# Patient Record
Sex: Female | Born: 1970
Health system: Southern US, Community
[De-identification: ages and names within clinical notes are randomized; demographics above are authoritative.]

## PROBLEM LIST (undated history)

## (undated) DIAGNOSIS — M940 Chondrocostal junction syndrome [Tietze]: Secondary | ICD-10-CM

## (undated) DIAGNOSIS — T4145XA Adverse effect of unspecified anesthetic, initial encounter: Secondary | ICD-10-CM

## (undated) DIAGNOSIS — F419 Anxiety disorder, unspecified: Secondary | ICD-10-CM

## (undated) DIAGNOSIS — Z8619 Personal history of other infectious and parasitic diseases: Secondary | ICD-10-CM

## (undated) DIAGNOSIS — K259 Gastric ulcer, unspecified as acute or chronic, without hemorrhage or perforation: Secondary | ICD-10-CM

## (undated) DIAGNOSIS — L719 Rosacea, unspecified: Secondary | ICD-10-CM

## (undated) DIAGNOSIS — M1990 Unspecified osteoarthritis, unspecified site: Secondary | ICD-10-CM

## (undated) DIAGNOSIS — Z9889 Other specified postprocedural states: Secondary | ICD-10-CM

## (undated) DIAGNOSIS — B019 Varicella without complication: Secondary | ICD-10-CM

## (undated) DIAGNOSIS — L409 Psoriasis, unspecified: Secondary | ICD-10-CM

## (undated) DIAGNOSIS — T8859XA Other complications of anesthesia, initial encounter: Secondary | ICD-10-CM

## (undated) DIAGNOSIS — K2970 Gastritis, unspecified, without bleeding: Secondary | ICD-10-CM

## (undated) DIAGNOSIS — K649 Unspecified hemorrhoids: Secondary | ICD-10-CM

## (undated) DIAGNOSIS — L52 Erythema nodosum: Secondary | ICD-10-CM

## (undated) DIAGNOSIS — D259 Leiomyoma of uterus, unspecified: Secondary | ICD-10-CM

## (undated) DIAGNOSIS — F32A Depression, unspecified: Secondary | ICD-10-CM

## (undated) DIAGNOSIS — R112 Nausea with vomiting, unspecified: Secondary | ICD-10-CM

## (undated) DIAGNOSIS — K5909 Other constipation: Secondary | ICD-10-CM

## (undated) DIAGNOSIS — K219 Gastro-esophageal reflux disease without esophagitis: Secondary | ICD-10-CM

## (undated) DIAGNOSIS — F329 Major depressive disorder, single episode, unspecified: Secondary | ICD-10-CM

## (undated) DIAGNOSIS — F450 Somatization disorder: Secondary | ICD-10-CM

## (undated) HISTORY — DX: Depression, unspecified: F32.A

## (undated) HISTORY — DX: Psoriasis, unspecified: L40.9

## (undated) HISTORY — DX: Major depressive disorder, single episode, unspecified: F32.9

## (undated) HISTORY — DX: Varicella without complication: B01.9

## (undated) HISTORY — DX: Gastro-esophageal reflux disease without esophagitis: K21.9

## (undated) HISTORY — DX: Leiomyoma of uterus, unspecified: D25.9

## (undated) HISTORY — DX: Somatization disorder: F45.0

## (undated) HISTORY — DX: Rosacea, unspecified: L71.9

## (undated) HISTORY — DX: Gastritis, unspecified, without bleeding: K29.70

## (undated) HISTORY — DX: Chondrocostal junction syndrome (tietze): M94.0

## (undated) HISTORY — PX: COLONOSCOPY WITH ESOPHAGOGASTRODUODENOSCOPY (EGD): SHX5779

## (undated) HISTORY — DX: Unspecified hemorrhoids: K64.9

## (undated) HISTORY — DX: Gastric ulcer, unspecified as acute or chronic, without hemorrhage or perforation: K25.9

## (undated) HISTORY — DX: Unspecified osteoarthritis, unspecified site: M19.90

## (undated) HISTORY — DX: Erythema nodosum: L52

## (undated) HISTORY — DX: Other constipation: K59.09

## (undated) HISTORY — DX: Personal history of other infectious and parasitic diseases: Z86.19

---
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
## 2002-02-19 HISTORY — PX: SALPINGECTOMY: SHX328

## 2002-02-19 HISTORY — PX: TONSILLECTOMY: SUR1361

## 2005-02-19 DIAGNOSIS — K259 Gastric ulcer, unspecified as acute or chronic, without hemorrhage or perforation: Secondary | ICD-10-CM

## 2005-02-19 HISTORY — DX: Gastric ulcer, unspecified as acute or chronic, without hemorrhage or perforation: K25.9

## 2015-03-24 LAB — HM PAP SMEAR: HM PAP: NORMAL

## 2016-02-20 DIAGNOSIS — K2970 Gastritis, unspecified, without bleeding: Secondary | ICD-10-CM

## 2016-02-20 HISTORY — DX: Gastritis, unspecified, without bleeding: K29.70

## 2016-03-20 LAB — TSH: TSH: 1.69 (ref 0.41–5.90)

## 2016-06-28 HISTORY — PX: ESOPHAGOGASTRODUODENOSCOPY: SHX1529

## 2016-10-05 LAB — HEMOGLOBIN A1C: Hemoglobin A1C: 5.2

## 2016-10-25 LAB — HM MAMMOGRAPHY

## 2017-01-03 DIAGNOSIS — H5213 Myopia, bilateral: Secondary | ICD-10-CM | POA: Diagnosis not present

## 2017-01-03 DIAGNOSIS — L719 Rosacea, unspecified: Secondary | ICD-10-CM | POA: Diagnosis not present

## 2017-01-09 MED FILL — DOXYCYCLINE HYCLATE 100 MG: 100 | 30 days supply | Qty: 60 | Fill #0

## 2017-01-12 ENCOUNTER — Encounter (HOSPITAL_COMMUNITY): Payer: Self-pay | Admitting: Nurse Practitioner

## 2017-01-12 ENCOUNTER — Emergency Department (HOSPITAL_COMMUNITY)
Admission: EM | Admit: 2017-01-12 | Discharge: 2017-01-12 | Disposition: A | Discharge status: Home / Self Care | Payer: 59 | Attending: Emergency Medicine | Admitting: Emergency Medicine

## 2017-01-12 ENCOUNTER — Emergency Department (HOSPITAL_COMMUNITY): Payer: 59

## 2017-01-12 DIAGNOSIS — R0789 Other chest pain: Secondary | ICD-10-CM | POA: Insufficient documentation

## 2017-01-12 DIAGNOSIS — M546 Pain in thoracic spine: Secondary | ICD-10-CM | POA: Diagnosis not present

## 2017-01-12 DIAGNOSIS — R079 Chest pain, unspecified: Secondary | ICD-10-CM | POA: Diagnosis not present

## 2017-01-12 DIAGNOSIS — R61 Generalized hyperhidrosis: Secondary | ICD-10-CM | POA: Diagnosis not present

## 2017-01-12 LAB — BASIC METABOLIC PANEL
Anion gap: 6 (ref 5–15)
BUN: 14 mg/dL (ref 6–20)
CALCIUM: 9.4 mg/dL (ref 8.9–10.3)
CHLORIDE: 107 mmol/L (ref 101–111)
CO2: 24 mmol/L (ref 22–32)
Creatinine, Ser: 1.06 mg/dL — ABNORMAL HIGH (ref 0.44–1.00)
Glucose, Bld: 85 mg/dL (ref 65–99)
Potassium: 4 mmol/L (ref 3.5–5.1)
SODIUM: 137 mmol/L (ref 135–145)

## 2017-01-12 LAB — CBC
HCT: 44.2 % (ref 36.0–46.0)
Hemoglobin: 15.5 g/dL — ABNORMAL HIGH (ref 12.0–15.0)
MCH: 30.9 pg (ref 26.0–34.0)
MCHC: 35.1 g/dL (ref 30.0–36.0)
MCV: 88.2 fL (ref 78.0–100.0)
PLATELETS: 206 10*3/uL (ref 150–400)
RBC: 5.01 MIL/uL (ref 3.87–5.11)
RDW: 12.7 % (ref 11.5–15.5)
WBC: 8.7 10*3/uL (ref 4.0–10.5)

## 2017-01-12 LAB — I-STAT TROPONIN, ED: TROPONIN I, POC: 0 ng/mL (ref 0.00–0.08)

## 2017-01-12 LAB — D-DIMER, QUANTITATIVE (NOT AT ARMC): D-Dimer, Quant: <0.27 ug/mL-FEU (ref 0.00–0.50)

## 2017-01-12 NOTE — ED Notes (Signed)
Will obtain bloodwork when patient returns from X-Ray.

## 2017-01-12 NOTE — Discharge Instructions (Signed)
Take 400-600 mg every 4-6 hours respectively.  You can use ice alternating 20 minutes on, 20 minutes off.  Please follow-up with your doctor in 2-3 days for recheck.  Please return to the emergency department if you develop any new or worsening symptoms.

## 2017-01-12 NOTE — ED Notes (Signed)
Pt ambulatory and independent at discharge.  Verbalized understanding of discharge instructions 

## 2017-01-12 NOTE — ED Triage Notes (Signed)
Pt is c/o left sided chest pain that radiates to back, worsens with deep breathing and movement. Rates 4/10.

## 2017-01-12 NOTE — ED Provider Notes (Signed)
[facility] DEPT Provider Note   CSN: [id number] Arrival date & time: 01/12/17  1859     History   Chief Complaint Chief Complaint  Patient presents with  . Chest Pain    HPI Mary [name] is a 46 y.o. female with history of depression who presents with a 1 day history of left-sided chest pain.  She describes it as a dull ache that has been constant since onset yesterday afternoon.  Began to get worse today around 2 PM.  Be she denies any injury or heavy lifting.  She has had radiation of pain to her left upper back.  She states it is worse with movement and deep breathing.  She has not taking any medications at home for symptoms.  She denies any history of heart problems, but does have family history.  She denies any recent long trips, surgeries, new leg pain or swelling, history of blood clots.  Patient does have a Nexplanon implant that she has had for a year.  She denies any associated coughing, shortness of breath, abdominal pain, nausea, vomiting, urinary symptoms, fevers.  She does note that she was feeling sweaty in the waiting room while she was waiting related to her pain.  HPI  Past Medical History:  Diagnosis Date  . Depression     There are no active problems to display for this patient.   Past Surgical History:  Procedure Laterality Date  . TONSILLECTOMY      OB History    No data available       Home Medications    Prior to Admission medications   Not on File    Family History History reviewed. No pertinent family history.  Social History Social History   Tobacco Use  . Smoking status: Never Smoker  . Smokeless tobacco: Never Used  Substance Use Topics  . Alcohol use: No    Frequency: Never  . Drug use: Not on file     Allergies   Patient has no known allergies.   Review of Systems Review of Systems  Constitutional: Positive for diaphoresis. Negative for chills and fever.  HENT: Negative for facial  swelling and sore throat.   Respiratory: Negative for cough and shortness of breath.   Cardiovascular: Positive for chest pain. Negative for leg swelling.  Gastrointestinal: Negative for abdominal pain, nausea and vomiting.  Genitourinary: Negative for dysuria.  Musculoskeletal: Negative for back pain.  Skin: Negative for rash and wound.  Neurological: Negative for headaches.  Psychiatric/Behavioral: The patient is not nervous/anxious.      Physical Exam Updated Vital Signs BP 118/86 (BP Location: Right Arm)   Pulse 73   Temp 97.7 F (36.5 C) (Oral)   Resp 18   Ht 5' 4.5" (1.638 m)   Wt 71.7 kg (158 lb)   SpO2 98%   BMI 26.70 kg/m   Physical Exam  Constitutional: She appears well-developed and well-nourished. No distress.  HENT:  Head: Normocephalic and atraumatic.  Mouth/Throat: Oropharynx is clear and moist. No oropharyngeal exudate.  Eyes: Conjunctivae are normal. Pupils are equal, round, and reactive to light. Right eye exhibits no discharge. Left eye exhibits no discharge. No scleral icterus.  Neck: Normal range of motion. Neck supple. No thyromegaly present.  Cardiovascular: Normal rate, regular rhythm, normal heart sounds and intact distal pulses. Exam reveals no gallop and no friction rub.  No murmur heard. Pulmonary/Chest: Effort normal and breath sounds normal. No stridor. No respiratory distress. She has no wheezes. She  has no rales. She exhibits no tenderness.  Abdominal: Soft. Bowel sounds are normal. She exhibits no distension. There is no tenderness. There is no rebound and no guarding.  Musculoskeletal: She exhibits no edema.       Back:  Lymphadenopathy:    She has no cervical adenopathy.  Neurological: She is alert. Coordination normal.  Skin: Skin is warm and dry. No rash noted. She is not diaphoretic. No pallor.  Psychiatric: She has a normal mood and affect.  Nursing note and vitals reviewed.    ED Treatments / Results  Labs (all labs ordered are  listed, but only abnormal results are displayed) Labs Reviewed  BASIC METABOLIC PANEL - Abnormal; Notable for the following components:      Result Value   Creatinine, Ser 1.06 (*)    All other components within normal limits  CBC - Abnormal; Notable for the following components:   Hemoglobin 15.5 (*)    All other components within normal limits  D-DIMER, QUANTITATIVE (NOT AT [facility])  I-STAT TROPONIN, ED    EKG  EKG Interpretation  Date/Time:  Saturday January 12 2017 19:06:19 EST Ventricular Rate:  84 PR Interval:    QRS Duration: 83 QT Interval:  371 QTC Calculation: 439 R Axis:   80 Text Interpretation:  Sinus rhythm Baseline wander in lead(s) V3 No STEMI.  Confirmed by Nanda Quinton [phone]) on 01/12/2017 10:51:43 PM       Radiology Dg Chest 2 View  Result Date: 01/12/2017 CLINICAL DATA:  Patient with left-sided chest pain. EXAM: CHEST  2 VIEW COMPARISON:  None. FINDINGS: Monitoring leads overlie the patient. Normal cardiac and mediastinal contours. No consolidative pulmonary opacities. No pleural effusion or pneumothorax. Regional skeleton is unremarkable. IMPRESSION: No acute cardiopulmonary process. Electronically Signed   By: Lovey Newcomer M.D.   On: 01/12/2017 19:40    Procedures Procedures (including critical care time)  Medications Ordered in ED Medications - No data to display   Initial Impression / Assessment and Plan / ED Course  I have reviewed the triage vital signs and the nursing notes.  Pertinent labs & imaging results that were available during my care of the patient were reviewed by me and considered in my medical decision making (see chart for details).     Patient with most likely chest wall pain, likely costochondritis.  Labs are unremarkable including negative troponin and d-dimer.  Patient had constant chest pain since yesterday, Delta troponin not indicated at this time.  EKG shows NSR.  Chest x-ray is negative.  Pain is reproducible on the back.   Will discharge home with supportive treatment including NSAIDs and ice.  Patient to follow-up with PCP for recheck and further evaluation.  Return precautions discussed.  Patient understands and agrees with plan.  Patient vitals stable throughout ED course and discharged in satisfactory condition.  Final Clinical Impressions(s) / ED Diagnoses   Final diagnoses:  Atypical chest pain  Acute left-sided thoracic back pain    ED Discharge Orders    None       Frederica Kuster, PA-C 01/13/17 0108    Margette Fast, MD 01/13/17 [phone]

## 2017-01-29 MED FILL — BUPROPION HCL XL 300 MG TAB: 300 | 90 days supply | Qty: 90 | Fill #0

## 2017-03-07 ENCOUNTER — Ambulatory Visit: Payer: 59 | Admitting: Family Medicine

## 2017-03-12 ENCOUNTER — Ambulatory Visit (INDEPENDENT_AMBULATORY_CARE_PROVIDER_SITE_OTHER): Payer: No Typology Code available for payment source | Admitting: Family Medicine

## 2017-03-12 ENCOUNTER — Encounter: Payer: Self-pay | Admitting: Family Medicine

## 2017-03-12 VITALS — BP 100/62 | HR 72 | Temp 98.4°F | Ht 65.0 in | Wt 154.4 lb

## 2017-03-12 DIAGNOSIS — F32A Depression, unspecified: Secondary | ICD-10-CM | POA: Insufficient documentation

## 2017-03-12 DIAGNOSIS — L409 Psoriasis, unspecified: Secondary | ICD-10-CM | POA: Diagnosis not present

## 2017-03-12 DIAGNOSIS — K5909 Other constipation: Secondary | ICD-10-CM

## 2017-03-12 DIAGNOSIS — F329 Major depressive disorder, single episode, unspecified: Secondary | ICD-10-CM

## 2017-03-12 DIAGNOSIS — Z7689 Persons encountering health services in other specified circumstances: Secondary | ICD-10-CM | POA: Diagnosis not present

## 2017-03-12 DIAGNOSIS — L719 Rosacea, unspecified: Secondary | ICD-10-CM | POA: Diagnosis not present

## 2017-03-12 DIAGNOSIS — Z789 Other specified health status: Secondary | ICD-10-CM | POA: Insufficient documentation

## 2017-03-12 HISTORY — DX: Other specified health status: Z78.9

## 2017-03-12 MED ORDER — SERTRALINE HCL 25 MG PO TABS: 25.0000 mg | ORAL_TABLET | Freq: Every day | ORAL | 1 refills | Status: DC

## 2017-03-12 MED ORDER — BUPROPION HCL ER (XL) 300 MG PO TB24
300.0000 mg | ORAL_TABLET | Freq: Every day | ORAL | 1 refills | Status: DC
Start: 2017-03-12 — End: 2017-06-20

## 2017-03-12 MED ORDER — DOXYCYCLINE HYCLATE 100 MG PO TBEC
100.0000 mg | DELAYED_RELEASE_TABLET | Freq: Every day | ORAL | 0 refills | Status: DC
Start: 2017-03-12 — End: 2017-04-12

## 2017-03-12 MED FILL — SERTRALINE HCL 25 MG TABS: 25 | 90 days supply | Qty: 90 | Fill #0

## 2017-03-12 MED FILL — DOXYCYCLINE HYC DR 100 MG T: 100 | 90 days supply | Qty: 90 | Fill #0

## 2017-03-12 NOTE — Patient Instructions (Signed)
It was nice to meet you today! Start using Miralax 1 cap in 8 ounces of water in the morning for chronic constipation. Make sure to try to increase water to at least 60 ounces a day.    I will need to get your records for GI and Rheumatology before considering referrals (they will want the records).   I have placed a referral to dermatology for you.    Please help [place] help you:  We are honored you have chosen [facility] for your Primary Care home. Below you will find basic instructions that you may need to access in the future. Please help [place] help you by reading the instructions, which cover many of the frequent questions we experience.   Prescription refills and request:  -In order to allow more efficient response time, please call your pharmacy for all refills. They will forward the request electronically to [place]. This allows for the quickest possible response. Request left on a nurse line can take longer to refill, since these are checked as time allows between office patients and other phone calls.  - refill request can take up to 3-5 working days to complete.  - If request is sent electronically and request is appropiate, it is usually completed in 1-2 business days.  - all patients will need to be seen routinely for all chronic medical conditions requiring prescription medications (see follow-up below). If you are overdue for follow up on your condition, you will be asked to make an appointment and we will call in enough medication to cover you until your appointment (up to 30 days).  - all controlled substances will require a face to face visit to request/refill.  - if you desire your prescriptions to go through a new pharmacy, and have an active script at original pharmacy, you will need to call your pharmacy and have scripts transferred to new pharmacy. This is completed between the pharmacy locations and not by your provider.    Results: If any images or labs were ordered, it can  take up to 1 week to get results depending on the test ordered and the lab/facility running and resulting the test. - Normal or stable results, which do not need further discussion, may be released to your mychart immediately with attached note to you. A call may not be generated for normal results. Please make certain to sign up for mychart. If you have questions on how to activate your mychart you can call the front office.  - If your results need further discussion, our office will attempt to contact you via phone, and if unable to reach you after 2 attempts, we will release your abnormal result to your mychart with instructions.  - All results will be automatically released in mychart after 1 week.  - Your provider will provide you with explanation and instruction on all relevant material in your results. Please keep in mind, results and labs may appear confusing or abnormal to the untrained eye, but it does not mean they are actually abnormal for you personally. If you have any questions about your results that are not covered, or you desire more detailed explanation than what was provided, you should make an appointment with your provider to do so.   Our office handles many outgoing and incoming calls daily. If we have not contacted you within 1 week about your results, please check your mychart to see if there is a message first and if not, then contact our office.  In helping with this matter, you help decrease call volume, and therefore allow [place] to be able to respond to patients needs more efficiently.   Acute office visits (sick visit):  An acute visit is intended for a new problem and are scheduled in shorter time slots to allow schedule openings for patients with new problems. This is the appropriate visit to discuss a new problem. In order to provide you with excellent quality medical care with proper time for you to explain your problem, have an exam and receive treatment with instructions,  these appointments should be limited to one new problem per visit. If you experience a new problem, in which you desire to be addressed, please make an acute office visit, we save openings on the schedule to accommodate you. Please do not save your new problem for any other type of visit, let us take care of it properly and quickly for you.   Follow up visits:  Depending on your condition(s) your provider will need to see you routinely in order to provide you with quality care and prescribe medication(s). Most chronic conditions (Example: hypertension, Diabetes, depression/anxiety... etc), require visits a couple times a year. Your provider will instruct you on proper follow up for your personal medical conditions and history. Please make certain to make follow up appointments for your condition as instructed. Failing to do so could result in lapse in your medication treatment/refills. If you request a refill, and are overdue to be seen on a condition, we will always provide you with a 30 day script (once) to allow you time to schedule.    Medicare wellness (well visit): - we have a wonderful Nurse Maudie Mercury), that will meet with you and provide you will yearly medicare wellness visits. These visits should occur yearly (can not be scheduled less than 1 calendar year apart) and cover preventive health, immunizations, advance directives and screenings you are entitled to yearly through your medicare benefits. Do not miss out on your entitled benefits, this is when medicare will pay for these benefits to be ordered for you.  These are strongly encouraged by your provider and is the appropriate type of visit to make certain you are up to date with all preventive health benefits. If you have not had your medicare wellness exam in the last 12 months, please make certain to schedule one by calling the office and schedule your medicare wellness with Maudie Mercury as soon as possible.   Yearly physical (well visit):  - Adults are  recommended to be seen yearly for physicals. Check with your insurance and date of your last physical, most insurances require one calendar year between physicals. Physicals include all preventive health topics, screenings, medical exam and labs that are appropriate for gender/age and history. You may have fasting labs needed at this visit. This is a well visit (not a sick visit), new problems should not be covered during this visit (see acute visit).  - Pediatric patients are seen more frequently when they are younger. Your provider will advise you on well child visit timing that is appropriate for your their age. - This is not a medicare wellness visit. Medicare wellness exams do not have an exam portion to the visit. Some medicare companies allow for a physical, some do not allow a yearly physical. If your medicare allows a yearly physical you can schedule the medicare wellness with our nurse Maudie Mercury and have your physical with your provider after, on the same day. Please check with insurance for  your full benefits.   Late Policy/No Shows:  - all new patients should arrive 15-30 minutes earlier than appointment to allow [place] time  to  obtain all personal demographics,  insurance information and for you to complete office paperwork. - All established patients should arrive 10-15 minutes earlier than appointment time to update all information and be checked in .  - In our best efforts to run on time, if you are late for your appointment you will be asked to either reschedule or if able, we will work you back into the schedule. There will be a wait time to work you back in the schedule,  depending on availability.  - If you are unable to make it to your appointment as scheduled, please call 24 hours ahead of time to allow [place] to fill the time slot with someone else who needs to be seen. If you do not cancel your appointment ahead of time, you may be charged a no show fee.

## 2017-03-12 NOTE — Progress Notes (Signed)
Patient ID: Mary [name], female  DOB: 1970/06/21, 47 y.o.   MRN: [id number] Patient Care Team    Relationship Specialty Notifications Start End  Ma Hillock, DO PCP - General Family Medicine  03/12/17   Leanora Ivanoff, MD Referring Physician Obstetrics  03/12/17    Comment: Unlimited referrals, established gynecologist    Chief Complaint  Patient presents with  . Establish Care    for chronic meds condition    Subjective:  Mary [name] is a 47 y.o.  female present for new patient establishment. All past medical history, surgical history, allergies, family history, immunizations, medications and social history were obtained in the electronic medical record today. All recent labs, ED visits and hospitalizations within the last year were reviewed.  Depression: Patient reports she has been on Wellbutrin 300 mg daily for little over a year. She had been on this medication approximately 10 years ago as well. She thought it was working well for her toe she went to her recent patch when changing employers. She was started on Zoloft 25 mg in addition to the Wellbutrin at that time. She feels the medication with the addition of Zoloft is working well for her and would like to continue both medications.  Rosacea: Patient reports a history of rosacea that has been treated for dermatology. On October 2018 she was tried on doxycycline for 200 mg and was reduced to 100 mg just recently. She feels since decreasing her dose she has had increasing rosacea like symptoms. She was like referral to dermatology locally to establish.  Chronic constipation: Patient reports having a history of being established gastroenterologist. She had an EEG can be completed which diagnosed her with a gastric ulcer, gastritis. She is currently not on medications for this condition. She had attempted to have a colonoscopy completed for bowel changes secondary to chronic constipation and was unable to complete the  prep her for colonoscopy was not completed. Patient currently does not take any medications nor has she tried MiraLAX for her chronic constipation. She endorses feeling very full in her system is backed up. It makes her feel nauseated. When she does have a bowel movement which is every couple days it is "rabbit pellets ." Patient does not bring any gastroenterology records with her.  Mood disorder screening: Negative today.  Depression screen [facility] 2/9 03/12/2017 03/12/2017  Decreased Interest 1 0  Down, Depressed, Hopeless 0 0  PHQ - 2 Score 1 0  Altered sleeping 0 -  Tired, decreased energy 1 -  Change in appetite 1 -  Feeling bad or failure about yourself  0 -  Trouble concentrating 0 -  Moving slowly or fidgety/restless 0 -  Suicidal thoughts 0 -  PHQ-9 Score 3 -  Difficult doing work/chores Not difficult at all -   GAD 7 : Generalized Anxiety Score 03/12/2017  Nervous, Anxious, on Edge 0  Control/stop worrying 0  Worry too much - different things 0  Trouble relaxing 0  Restless 0  Easily annoyed or irritable 0  Afraid - awful might happen 0  Total GAD 7 Score 0  Anxiety Difficulty Not difficult at all     Current Exercise Habits: The patient does not participate in regular exercise at present   No flowsheet data found.   Immunization History  Administered Date(s) Administered  . Influenza,inj,Quad PF,6+ Mos 03/12/2016  . Influenza,inj,quad, With Preservative 12/07/2014  . Influenza-Unspecified 11/14/2015  . Tdap 07/26/2014, 02/20/2015    No exam data  present  Past Medical History:  Diagnosis Date  . Arthritis    Neck, left ankle, clavicle  . Chickenpox 1987  . Chronic constipation   . Costochondritis   . Depression   . Erythema nodosum   . Fibroid uterus   . Gastritis 2018  . GERD (gastroesophageal reflux disease)   . Hemorrhoid   . History of PCR DNA positive for HSV1    buttocks area  . Psoriasis   . Rosacea   . Stomach ulcer 2007   No Known  Allergies Past Surgical History:  Procedure Laterality Date  . COLONOSCOPY WITH ESOPHAGOGASTRODUODENOSCOPY (EGD)  2007, 2008   Patient reports colonoscopy was unsuccessful secondary to her not being able to tolerate prep. Was being completed for chronic constipation.  Marland Kitchen SALPINGECTOMY Right 2004   ectopic pregnancy  . TONSILLECTOMY  2004   Family History  Problem Relation Age of Onset  . Gout Mother   . Rheum arthritis Mother   . Osteoarthritis Mother   . Breast cancer Mother   . Depression Mother   . Coronary artery disease Mother   . Hyperlipidemia Mother   . Hypertension Mother   . Kidney disease Mother   . Alcohol abuse Father   . Arthritis Father   . Hearing loss Father   . Hyperlipidemia Father   . Hypertension Father   . Psoriasis Father   . Arthritis Brother   . Hyperlipidemia Brother   . Hypertension Brother   . Psoriasis Brother   . Breast cancer Maternal Grandmother   . Heart murmur Maternal Grandmother   . Breast cancer Paternal Grandmother   . Psoriasis Paternal Grandmother   . Dementia Paternal Grandmother   . Heart attack Paternal Grandfather   . Stroke Paternal Grandfather   . Coronary artery disease Paternal Grandfather    Social History   Socioeconomic History  . Marital status: Single    Spouse name: Not on file  . Number of children: Not on file  . Years of education: Not on file  . Highest education level: Not on file  Social Needs  . Financial resource strain: Not on file  . Food insecurity - worry: Not on file  . Food insecurity - inability: Not on file  . Transportation needs - medical: Not on file  . Transportation needs - non-medical: Not on file  Occupational History  . Not on file  Tobacco Use  . Smoking status: Never Smoker  . Smokeless tobacco: Never Used  Substance and Sexual Activity  . Alcohol use: No    Frequency: Never  . Drug use: Yes  . Sexual activity: No    Partners: Male    Birth control/protection: Implant     Comment: nexplanon 09/2015  Other Topics Concern  . Not on file  Social History Narrative   Single. Bachelor's degree. Works as a traveling Therapist, sports.   Wears a bicycle helmet.   Vegetarian diet.   Drinks caffeine.   Smoke alarm in the home. Wears her seatbelt.   Feels safe in her relationships.   Allergies as of 03/12/2017   No Known Allergies     Medication List        Accurate as of 03/12/17  6:01 PM. Always use your most recent med list.          ALREX 0.2 % Susp Generic drug:  loteprednol 1 drop 2 (two) times daily as needed.   buPROPion 300 MG 24 hr tablet Commonly known as:  WELLBUTRIN XL  Take 1 tablet (300 mg total) by mouth daily.   doxycycline 100 MG EC tablet Commonly known as:  DORYX Take 1 tablet (100 mg total) by mouth daily.   sertraline 25 MG tablet Commonly known as:  ZOLOFT Take 1 tablet (25 mg total) by mouth daily.   tretinoin 0.05 % cream Commonly known as:  RETIN-A Apply topically at bedtime.   valACYclovir 500 MG tablet Commonly known as:  VALTREX Take 1,000 mg by mouth 2 (two) times daily as needed.       All past medical history, surgical history, allergies, family history, immunizations andmedications were updated in the EMR today and reviewed under the history and medication portions of their EMR.    Recent Results (from the past 2160 hour(s))  Basic metabolic panel     Status: Abnormal   Collection Time: 01/12/17  7:34 PM  Result Value Ref Range   Sodium 137 135 - 145 mmol/L   Potassium 4.0 3.5 - 5.1 mmol/L   Chloride 107 101 - 111 mmol/L   CO2 24 22 - 32 mmol/L   Glucose, Bld 85 65 - 99 mg/dL   BUN 14 6 - 20 mg/dL   Creatinine, Ser 1.06 (H) 0.44 - 1.00 mg/dL   Calcium 9.4 8.9 - 10.3 mg/dL   GFR calc non Af Amer >60 >60 mL/min   GFR calc Af Amer >60 >60 mL/min    Comment: (NOTE) The eGFR has been calculated using the CKD EPI equation. This calculation has not been validated in all clinical situations. eGFR's persistently <60 mL/min  signify possible Chronic Kidney Disease.    Anion gap 6 5 - 15  CBC     Status: Abnormal   Collection Time: 01/12/17  7:34 PM  Result Value Ref Range   WBC 8.7 4.0 - 10.5 K/uL   RBC 5.01 3.87 - 5.11 MIL/uL   Hemoglobin 15.5 (H) 12.0 - 15.0 g/dL   HCT 44.2 36.0 - 46.0 %   MCV 88.2 78.0 - 100.0 fL   MCH 30.9 26.0 - 34.0 pg   MCHC 35.1 30.0 - 36.0 g/dL   RDW 12.7 11.5 - 15.5 %   Platelets 206 150 - 400 K/uL  I-stat troponin, ED     Status: None   Collection Time: 01/12/17  7:48 PM  Result Value Ref Range   Troponin i, poc 0.00 0.00 - 0.08 ng/mL   Comment 3            Comment: Due to the release kinetics of cTnI, a negative result within the first hours of the onset of symptoms does not rule out myocardial infarction with certainty. If myocardial infarction is still suspected, repeat the test at appropriate intervals.   D-dimer, quantitative     Status: None   Collection Time: 01/12/17  9:31 PM  Result Value Ref Range   D-Dimer, Quant <0.27 0.00 - 0.50 ug/mL-FEU    Comment: (NOTE) At the manufacturer cut-off of 0.50 ug/mL FEU, this assay has been documented to exclude PE with a sensitivity and negative predictive value of 97 to 99%.  At this time, this assay has not been approved by the FDA to exclude DVT/VTE. Results should be correlated with clinical presentation.     Dg Chest 2 View  Result Date: 01/12/2017 CLINICAL DATA:  Patient with left-sided chest pain. EXAM: CHEST  2 VIEW COMPARISON:  None. FINDINGS: Monitoring leads overlie the patient. Normal cardiac and mediastinal contours. No consolidative pulmonary opacities. No pleural effusion or  pneumothorax. Regional skeleton is unremarkable. IMPRESSION: No acute cardiopulmonary process. Electronically Signed   By: Lovey Newcomer M.D.   On: 01/12/2017 19:40     ROS: 14 pt review of systems performed and negative (unless mentioned in an HPI)  Objective: BP 100/62 (BP Location: Left Arm, Patient Position: Sitting, Cuff  Size: Normal)   Pulse 72   Temp 98.4 F (36.9 C) (Oral)   Ht _0  (1.651 m)   Wt 154 lb 6.4 oz (70 kg)   SpO2 99%   BMI 25.69 kg/m  Gen: Afebrile. No acute distress. Nontoxic in appearance, well-developed, well-nourished,  pleasant, Caucasian female. HENT: AT. [place]. MMM, no oral lesions, no cough. No hoarseness. Eyes:Pupils Equal Round Reactive to light, Extraocular movements intact,  Conjunctiva without redness, discharge or icterus. Neck/lymp/endocrine: Supple, no lymphadenopathy CV: RRR no murmur, no edema, +2/4 P posterior tibialis pulses.  Chest: CTAB, no wheeze, rhonchi or crackles. Normal Respiratory effort. Good Air movement. Abd: Soft. NTND. BS present. Skin:   Warm and well-perfused. Skin intact. Neuro/Msk:  Normal gait. PERLA. EOMi. Alert. Oriented x3.   Psych: Normal affect, dress and demeanor. Normal speech. Normal thought content and judgment.   Assessment/plan: Mykhia Danish is a 47 y.o. female present for establish care. Major depressive disorder, remission status unspecified, unspecified whether recurrent - Stable. Patient doing well on Wellbutrin 300 XL and Zoloft 25 mg daily. Refill both these medications for her today. - Follow-up 6 months (can be her CPE as long as stable)  Rosacea/Psoriasis - Refills provided on doxycycline 100 mg daily. Referral placed for local dermatologist to establish since her sister is worsening. - Ambulatory referral to Dermatology  Chronic constipation - Vicente Serene patient to first try MiraLAX 1 Capful daily in 8 ounces of water. I encouraged her to do this first thing in the morning before drinking coffee to set a pattern. Increase water consumption to at least 60-80 ounces a day. Records requested from prior gastroenterologist. There are no red flags on exam or her history available currently. If MiraLAX is helpful, patient to follow-up in 2-4 weeks and would consider GI referral. Would like to have records prior to any  referrals.    Return in about 6 months (around 09/09/2017) for CPE.  Greater than 45 minutes was spent with patient, greater than 50% of that time was spent face-to-face with patient counseling and/or coordinating care.    Note is dictated utilizing voice recognition software. Although note has been proof read prior to signing, occasional typographical errors still can be missed. If any questions arise, please do not hesitate to call for verification.  Electronically signed by: Howard Pouch, DO [place]

## 2017-03-14 ENCOUNTER — Encounter: Payer: Self-pay | Admitting: Family Medicine

## 2017-03-14 DIAGNOSIS — F329 Major depressive disorder, single episode, unspecified: Secondary | ICD-10-CM | POA: Insufficient documentation

## 2017-03-14 DIAGNOSIS — F32A Depression, unspecified: Secondary | ICD-10-CM | POA: Insufficient documentation

## 2017-03-14 DIAGNOSIS — F450 Somatization disorder: Secondary | ICD-10-CM | POA: Insufficient documentation

## 2017-03-14 DIAGNOSIS — Z975 Presence of (intrauterine) contraceptive device: Secondary | ICD-10-CM | POA: Insufficient documentation

## 2017-03-14 MED FILL — AZELAIC ACID 15 % GEL: 15 | 30 days supply | Qty: 50 | Fill #0

## 2017-03-15 ENCOUNTER — Encounter: Payer: Self-pay | Admitting: *Deleted

## 2017-03-15 ENCOUNTER — Telehealth: Payer: Self-pay | Admitting: Family Medicine

## 2017-03-15 NOTE — Telephone Encounter (Signed)
The enteric coated pill of doxycyline was ordered for her. Depending on the manufacture the color can be different DESPITE them both being coated. I advise her to call her pharmacist, I ordered the one she told us she took and it is enteric.

## 2017-03-15 NOTE — Telephone Encounter (Signed)
Confirmed with pharmacy they dispensed EC tablet to patient which is what was ordered. Left message explaining to patient that different manufacturers make same pills in different colors it is still the same medication.

## 2017-03-15 NOTE — Telephone Encounter (Signed)
Please advise 

## 2017-03-15 NOTE — Telephone Encounter (Signed)
Copied from [place]. Topic: Quick Communication - See Telephone Encounter >> Mar 15, 2017  3:45 PM Vernona Rieger wrote: CRM for notification. See Telephone encounter for:   03/15/17.   Patient said she cant take doxycycline (DORYX) 100 MG EC tablet bc of her stomach issues. She said she can take the little orange pill that's 100mg . Doxycycline.  [place], [place]

## 2017-03-22 ENCOUNTER — Encounter: Payer: Self-pay | Admitting: Family Medicine

## 2017-03-26 ENCOUNTER — Telehealth: Payer: Self-pay | Admitting: Family Medicine

## 2017-03-26 NOTE — Telephone Encounter (Signed)
Copied from [place]. Topic: Referral - Request >> Mar 26, 2017  3:47 PM Corie Chiquito, [place] wrote: Reason for CRM: Patient calling because she would like to have a referral to a female GYN. If someone could give her a call back about this at [phone]

## 2017-03-27 NOTE — Telephone Encounter (Signed)
Left a message for patient to contact Insurance company to see if referral is necessary.

## 2017-04-12 ENCOUNTER — Encounter: Payer: Self-pay | Admitting: Family Medicine

## 2017-04-12 ENCOUNTER — Ambulatory Visit (INDEPENDENT_AMBULATORY_CARE_PROVIDER_SITE_OTHER): Payer: No Typology Code available for payment source | Admitting: Family Medicine

## 2017-04-12 VITALS — BP 99/64 | HR 76 | Temp 98.2°F | Resp 16

## 2017-04-12 DIAGNOSIS — M654 Radial styloid tenosynovitis [de Quervain]: Secondary | ICD-10-CM

## 2017-04-12 NOTE — Patient Instructions (Addendum)
Wear your wrist splint as much as possible during the day and wear it after work and during sleep.    De Quervain Tenosynovitis Tendons attach muscles to bones. They also help with joint movements. When tendons become irritated or swollen, it is called tendinitis. The extensor pollicis brevis (EPB) tendon connects the EPB muscle to a bone that is near the base of the thumb. The EPB muscle helps to straighten and extend the thumb. De Quervain tenosynovitis is a condition in which the EPB tendon lining (sheath) becomes irritated, thickened, and swollen. This condition is sometimes called stenosing tenosynovitis. This condition causes pain on the thumb side of the back of the wrist. What are the causes? Causes of this condition include:  Activities that repeatedly cause your thumb and wrist to extend.  A sudden increase in activity or change in activity that affects your wrist.  What increases the risk? This condition is more likely to develop in:  Females.  People who have diabetes.  Women who have recently given birth.  People who are over 87 years of age.  People who do activities that involve repeated hand and wrist motions, such as tennis, racquetball, volleyball, gardening, and taking care of children.  People who do heavy labor.  People who have poor wrist strength and flexibility.  People who do not warm up properly before activities.  What are the signs or symptoms? Symptoms of this condition include:  Pain or tenderness over the thumb side of the back of the wrist when your thumb and wrist are not moving.  Pain that gets worse when you straighten your thumb or extend your thumb or wrist.  Pain when the injured area is touched.  Locking or catching of the thumb joint while you bend and straighten your thumb.  Decreased thumb motion due to pain.  Swelling over the affected area.  How is this diagnosed? This condition is diagnosed with a medical history and  physical exam. Your health care provider will ask for details about your injury and ask about your symptoms. How is this treated? Treatment may include the use of icing and medicines to reduce pain and swelling. You may also be advised to wear a splint or brace to limit your thumb and wrist motion. In less severe cases, treatment may also include working with a physical therapist to strengthen your wrist and calm the irritation around your EPB tendon sheath. In severe cases, surgery may be needed. Follow these instructions at home: If you have a splint or brace:  Wear it as told by your health care provider. Remove it only as told by your health care provider.  Loosen the splint or brace if your fingers become numb and tingle, or if they turn cold and blue.  Keep the splint or brace clean and dry. Managing pain, stiffness, and swelling  If directed, apply ice to the injured area. ? Put ice in a plastic bag. ? Place a towel between your skin and the bag. ? Leave the ice on for 20 minutes, 2-3 times per day.  Move your fingers often to avoid stiffness and to lessen swelling.  Raise (elevate) the injured area above the level of your heart while you are sitting or lying down. General instructions  Return to your normal activities as told by your health care provider. Ask your health care provider what activities are safe for you.  Take over-the-counter and prescription medicines only as told by your health care provider.  Keep all  follow-up visits as told by your health care provider. This is important.  Do not drive or operate heavy machinery while taking prescription pain medicine. Contact a health care provider if:  Your pain, tenderness, or swelling gets worse, even if you have had treatment.  You have numbness or tingling in your wrist, hand, or fingers on the injured side. This information is not intended to replace advice given to you by your health care provider. Make sure you  discuss any questions you have with your health care provider. Document Released: 02/05/2005 Document Revised: 07/14/2015 Document Reviewed: 04/13/2014 Elsevier Interactive Patient Education  [employer].

## 2017-04-12 NOTE — Progress Notes (Signed)
OFFICE VISIT  04/12/2017   CC:  Chief Complaint  Patient presents with  . Wrist Pain    right   HPI:    Patient is a 47 y.o.  female who presents for right wrist pain. Says R wrist was swollen about 2 weeks ago, now hurting some. Hurts at base of thumb area and over radial styloid region of wrist extending into distal forearm.  Worse with touching it and with gripping things and using wrist. No redness noted.  Swelling went away. No meds tried.  No splint tried.  No ice or heat tried. No trauma or strain recalled. 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  Past Medical History:  Diagnosis Date  . Arthritis    Neck, left ankle, clavicle  . Chickenpox 1987  . Chronic constipation   . Costochondritis   . Depression with somatization   . Erythema nodosum   . Fibroid uterus   . Gastritis 2018  . GERD (gastroesophageal reflux disease)   . Hemorrhoid   . History of PCR DNA positive for HSV1    buttocks area  . Psoriasis   . Rosacea   . Stomach ulcer 2007    Past Surgical History:  Procedure Laterality Date  . COLONOSCOPY WITH ESOPHAGOGASTRODUODENOSCOPY (EGD)  2007, 2008   Patient reports colonoscopy was unsuccessful secondary to her not being able to tolerate prep. Was being completed for chronic constipation.  . ESOPHAGOGASTRODUODENOSCOPY  06/28/2016   gastritis; gastric antrum  . SALPINGECTOMY Right 2004   ectopic pregnancy  . TONSILLECTOMY  2004    Outpatient Medications Prior to Visit  Medication Sig Dispense Refill  . buPROPion (WELLBUTRIN XL) 300 MG 24 hr tablet Take 1 tablet (300 mg total) by mouth daily. 90 tablet 1  . loteprednol (ALREX) 0.2 % SUSP 1 drop 2 (two) times daily as needed.    . sertraline (ZOLOFT) 25 MG tablet Take 1 tablet (25 mg total) by mouth daily. 90 tablet 1  . tretinoin (RETIN-A) 0.05 % cream Apply topically at bedtime.    . valACYclovir (VALTREX) 500 MG tablet Take 1,000 mg by mouth 2 (two)  times daily as needed.    . doxycycline (DORYX) 100 MG EC tablet Take 1 tablet (100 mg total) by mouth daily. (Patient not taking: Reported on 04/12/2017) 90 tablet 0   No facility-administered medications prior to visit.     Allergies  Allergen Reactions  . Molds & Smuts Shortness Of Breath    ROS As per HPI  PE: Blood pressure 99/64, pulse 76, temperature 98.2 F (36.8 C), temperature source Oral, resp. rate 16, SpO2 98 %. Gen: Alert, well appearing.  Patient is oriented to person, place, time, and situation. AFFECT: pleasant, lucid thought and speech. R wrist: no swelling or deformity or erythema or warmth. She has mild/mod tenderness over thumb extensor tendons as they course over her wrist into distal radius region.  ROM intact.  Minimally positive finkelstein's test. Pain is worsened with wrist extension and a little bit with wrist eversion. No wrist joint or finger joint tenderness.  LABS:  none  IMPRESSION AND PLAN:  Right wrist/thumb De Quervain's tenosynovitis. Pt fitted with wrist splint in office today (thumb spica). Instructions: Wear your wrist splint as much as possible during the day and wear it after work and during sleep.  An After Visit Summary was printed and given to the patient.  FOLLOW UP: Return if  symptoms worsen or fail to improve in 3-4 weeks with use of splint.   Signed:  Crissie Sickles, MD           04/12/2017

## 2017-04-24 ENCOUNTER — Encounter: Payer: No Typology Code available for payment source | Admitting: Family Medicine

## 2017-05-01 ENCOUNTER — Ambulatory Visit (INDEPENDENT_AMBULATORY_CARE_PROVIDER_SITE_OTHER): Payer: No Typology Code available for payment source | Admitting: Family Medicine

## 2017-05-01 ENCOUNTER — Encounter: Payer: Self-pay | Admitting: Family Medicine

## 2017-05-01 VITALS — BP 120/85 | HR 72 | Temp 97.8°F | Resp 20 | Ht 65.0 in | Wt 162.0 lb

## 2017-05-01 DIAGNOSIS — M659 Synovitis and tenosynovitis, unspecified: Secondary | ICD-10-CM

## 2017-05-01 MED ORDER — NAPROXEN 500 MG PO TABS: 500.0000 mg | ORAL_TABLET | Freq: Two times a day (BID) | ORAL | 0 refills | Status: DC

## 2017-05-01 MED ORDER — OMEPRAZOLE 40 MG PO CPDR: 40.0000 mg | DELAYED_RELEASE_CAPSULE | Freq: Every day | ORAL | 3 refills | Status: DC

## 2017-05-01 NOTE — Patient Instructions (Signed)
Continue the brace as you are. Start naproxen every 12 hours with food for at least 7 days. Start omeprazole daily for 1 month while on naproxen.  I have referred you to sports med to follow and offer injection if needed.     Tenosynovitis Tenosynovitis is inflammation of a tendon and the sleeve of tissue that covers the tendon (tendon sheath). A tendon is a cord of tissue that connects muscle to bone. Normally, a tendon slides smoothly inside its tendon sheath. Tenosynovitis limits movement of the tendon and surrounding tissues, which may cause pain and stiffness. Tenosynovitis can affect any tendon and tendon sheath. Commonly affected areas include tendons in the:  Shoulder.  Arm.  Hand.  Hip.  Leg.  Foot.  What are the causes? The main cause of this condition is wear and tear over time that results in slight tears in the tendon. Other possible causes include:  A sudden injury to the tendon or tendon sheath.  A disease that causes inflammation in the body.  An infection that spreads to the tendon and tendon sheath from a skin wound.  An infection in another part of the body that spreads to the tendon and tendon sheath through the blood.  What increases the risk? The following factors may make you more likely to develop this condition:  Having rheumatoid arthritis, gout, or diabetes.  Using IV drugs.  Doing physical activities that can cause tendon overuse and stress.  Having gonorrhea.  What are the signs or symptoms? Symptoms of this condition depend on the cause. Symptoms may include:  Pain with movement.  Pain and tenderness when pressing on the tendon and tendon sheath.  Swelling.  Stiffness.  If tenosynovitis is caused by an infection, symptoms may also include:  Fever.  Redness.  Warmth.  How is this diagnosed? This condition may be diagnosed based on your medical history and a physical exam. You also may have:  Blood tests.  Imaging tests,  such as: ? MRI. ? Ultrasound.  A sample of fluid removed from inside the tendon sheath to be checked in a lab.  How is this treated? Treatment for this condition depends on the cause. If tenosynovitis is not caused by an infection, treatment may include:  Resting the tendon.  Keeping the tendon in place for periods of time (immobilization) in a splint, brace, or sling.  NSAIDs to reduce pain and swelling.  A shot (injection) of medicine to help reduce pain and swelling (steroid).  Icing or applying heat to the affected area.  Physical therapy.  Surgery to release the tendon in the sheath or to repair damage to the tendon or tendon sheath. Surgery may be done if other treatments do not help relieve symptoms.  If tenosynovitis is caused by infection, treatment may include antibiotic medicine given through an IV. In some cases, surgery may be needed to drain fluid from the tendon sheath or to remove the tendon sheath. Follow these instructions at home: If you have a splint, brace, or sling:   Wear the splint, brace, or sling as told by your health care provider. Remove it only as told by your health care provider.  Loosen the splint, brace, or sling if your fingers or toes tingle, become numb, or turn cold and blue.  Do not let your splint or brace get wet if it is not waterproof. ? Do not take baths, swim, or use a hot tub until your health care provider approves. Ask your health care provider  if you can take showers. ? If your splint, brace, or sling is not waterproof, cover it with a watertight covering when you take a bath or a shower.  Keep the splint, brace, or sling clean. Managing pain, stiffness, and swelling  If directed, apply ice to the affected area. ? Put ice in a plastic bag. ? Place a towel between your skin and the bag. ? Leave the ice on for 20 minutes, 2-3 times a day.  Move the fingers or toes of the affected limb often, if this applies. This can help to  prevent stiffness and lessen swelling.  If directed, raise (elevate) the affected area above the level of your heart while you are sitting or lying down.  If directed, apply heat to the affected area as often as told by your health care provider. Use the heat source that your health care provider recommends, such as a moist heat pack or a heating pad. ? Place a towel between your skin and the heat source. ? Leave the heat on for 20-30 minutes. ? Remove the heat if your skin turns bright red. This is especially important if you are unable to feel pain, heat, or cold. You may have a greater risk of getting burned. Driving  Do not drive or operate heavy machinery while taking prescription pain medicine.  Ask your health care provider when it is safe to drive if you have a splint or brace on any part of your arm or leg. Activity  Return to your normal activities as told by your health care provider. Ask your health care provider what activities are safe for you.  Rest the affected area as told by your health care provider.  Avoid using the affected area while you are having symptoms of tenosynovitis.  If physical therapy was prescribed, do exercises as told by your health care provider. Safety  Do not use the injured limb to support your body weight until your health care provider says that you can. General instructions  Take over-the-counter and prescription medicines only as told by your health care provider.  Keep all follow-up visits as told by your health care provider. This is important. Contact a health care provider if:  Your symptoms are not improving or are getting worse.  You have a fever and more of any of the following symptoms: ? Pain. ? Redness. ? Warmth. ? Swelling. Get help right away if:  Your fingers or toes become numb or turn blue. This information is not intended to replace advice given to you by your health care provider. Make sure you discuss any  questions you have with your health care provider. Document Released: 02/05/2005 Document Revised: 10/06/2015 Document Reviewed: 12/15/2014 Elsevier Interactive Patient Education  [employer].

## 2017-05-01 NOTE — Progress Notes (Signed)
Mary [name] , 08-23-1970, 47 y.o., female MRN: [id number] Patient Care Team    Relationship Specialty Notifications Start End  Ma Hillock, DO PCP - General Family Medicine  03/12/17   Leanora Ivanoff, MD Referring Physician Obstetrics  03/12/17    Comment: Unlimited referrals, established gynecologist    Chief Complaint  Patient presents with  . Wrist Pain    right thumb and wrist worse     Subjective: Pt presents for an OV with complaints of right wrist pain  of since prior to her last visit 04/12/2017 for this issue. Patient was seen by another provider and told to wear a wrist splint (provided)  for De Quervain's tenosynovitis. Since that time patient states her wrist is about the same without great improvement.    Prior note:  Patient is a 47 y.o.  female who presents for right wrist pain. Says R wrist was swollen about 2 weeks ago, now hurting some. Hurts at base of thumb area and over radial styloid region of wrist extending into distal forearm.  Worse with touching it and with gripping things and using wrist. No redness noted.  Swelling went away. No meds tried.  No splint tried.  No ice or heat tried. No trauma or strain recalled. 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  Depression screen [facility] 2/9 03/12/2017 03/12/2017  Decreased Interest 1 0  Down, Depressed, Hopeless 0 0  PHQ - 2 Score 1 0  Altered sleeping 0 -  Tired, decreased energy 1 -  Change in appetite 1 -  Feeling bad or failure about yourself  0 -  Trouble concentrating 0 -  Moving slowly or fidgety/restless 0 -  Suicidal thoughts 0 -  PHQ-9 Score 3 -  Difficult doing work/chores Not difficult at all -    Allergies  Allergen Reactions  . Molds & Smuts Shortness Of Breath   Social History   Tobacco Use  . Smoking status: Never Smoker  . Smokeless tobacco: Never Used  Substance Use Topics  . Alcohol use: No    Frequency: Never   Past Medical  History:  Diagnosis Date  . Arthritis    Neck, left ankle, clavicle  . Chickenpox 1987  . Chronic constipation   . Costochondritis   . Depression with somatization   . Erythema nodosum   . Fibroid uterus   . Gastritis 2018  . GERD (gastroesophageal reflux disease)   . Hemorrhoid   . History of PCR DNA positive for HSV1    buttocks area  . Psoriasis   . Rosacea   . Stomach ulcer 2007   Past Surgical History:  Procedure Laterality Date  . COLONOSCOPY WITH ESOPHAGOGASTRODUODENOSCOPY (EGD)  2007, 2008   Patient reports colonoscopy was unsuccessful secondary to her not being able to tolerate prep. Was being completed for chronic constipation.  . ESOPHAGOGASTRODUODENOSCOPY  06/28/2016   gastritis; gastric antrum  . SALPINGECTOMY Right 2004   ectopic pregnancy  . TONSILLECTOMY  2004   Family History  Problem Relation Age of Onset  . Gout Mother   . Rheum arthritis Mother   . Osteoarthritis Mother   . Breast cancer Mother   . Depression Mother   . Coronary artery disease Mother   . Hyperlipidemia Mother   . Hypertension Mother   . Kidney disease Mother   . Alcohol abuse Father   . Arthritis Father   . Hearing  loss Father   . Hyperlipidemia Father   . Hypertension Father   . Psoriasis Father   . Arthritis Brother   . Hyperlipidemia Brother   . Hypertension Brother   . Psoriasis Brother   . Breast cancer Maternal Grandmother   . Heart murmur Maternal Grandmother   . Breast cancer Paternal Grandmother   . Psoriasis Paternal Grandmother   . Dementia Paternal Grandmother   . Heart attack Paternal Grandfather   . Stroke Paternal Grandfather   . Coronary artery disease Paternal Grandfather    Allergies as of 05/01/2017      Reactions   Molds & Smuts Shortness Of Breath      Medication List        Accurate as of 05/01/17  2:28 PM. Always use your most recent med list.          ALREX 0.2 % Susp Generic drug:  loteprednol 1 drop 2 (two) times daily as needed.     buPROPion 300 MG 24 hr tablet Commonly known as:  WELLBUTRIN XL Take 1 tablet (300 mg total) by mouth daily.   sertraline 25 MG tablet Commonly known as:  ZOLOFT Take 1 tablet (25 mg total) by mouth daily.   tretinoin 0.05 % cream Commonly known as:  RETIN-A Apply topically at bedtime.   valACYclovir 500 MG tablet Commonly known as:  VALTREX Take 1,000 mg by mouth 2 (two) times daily as needed.       All past medical history, surgical history, allergies, family history, immunizations andmedications were updated in the EMR today and reviewed under the history and medication portions of their EMR.     ROS: Negative, with the exception of above mentioned in HPI   Objective:  BP 120/85 (BP Location: Left Arm, Patient Position: Sitting, Cuff Size: Large)   Pulse 72   Temp 97.8 F (36.6 C)   Resp 20   Ht 5\' 5"  (1.651 m)   Wt 162 lb (73.5 kg)   SpO2 99%   BMI 26.96 kg/m  Body mass index is 26.96 kg/m. Gen: Afebrile. No acute distress. Nontoxic in appearance, well developed, well nourished.  HENT: AT. [place]. . MMM, no oral lesions.  Eyes:Pupils Equal Round Reactive to light, Extraocular movements intact,  Conjunctiva without redness, discharge or icterus. MSK (right wrist): no erythema, no soft tissue swelling. TTP radial aspect of wrist through first 1/3 distal radius area. + Finkelstein. Neg tinels.  Discomfort w/ resisted pronation/supination. NV intact distally.    No exam data present No results found. No results found for this or any previous visit (from the past 24 hour(s)).  Assessment/Plan: Mary [name] is a 47 y.o. female present for OV for  Tenosynovitis Discussed multiple options with patient and decided on Sports med referral. She may benefit from [place] image and/or injection.  Continue wrist brace. Start Naproxen BID with food with PPI. Both prescribed today.  - naproxen (NAPROSYN) 500 MG tablet; Take 1 tablet (500 mg total) by mouth 2 (two) times daily with  a meal.  Dispense: 30 tablet; Refill: 0 - omeprazole (PRILOSEC) 40 MG capsule; Take 1 capsule (40 mg total) by mouth daily.  Dispense: 30 capsule; Refill: 3 - Ambulatory referral to Sports Medicine - If improving on naproxen by the time she is seen, they can at least give instructions on stretches/therapy.    Reviewed expectations re: course of current medical issues.  Discussed self-management of symptoms.  Outlined signs and symptoms indicating need for more acute intervention.  Patient verbalized understanding and all questions were answered.  Patient received an After-Visit Summary.    No orders of the defined types were placed in this encounter.    Note is dictated utilizing voice recognition software. Although note has been proof read prior to signing, occasional typographical errors still can be missed. If any questions arise, please do not hesitate to call for verification.   electronically signed by:  Howard Pouch, DO  [place]

## 2017-05-03 ENCOUNTER — Encounter: Payer: Self-pay | Admitting: Family Medicine

## 2017-05-15 NOTE — Progress Notes (Signed)
[facility] [place], [place] [address] Phone: [phone] Subjective:    I'm seeing this patient by the request  of:  Kuneff, Renee A, DO   CC: Wrist pain  [id number]  Mary [name] is a 47 y.o. female coming in with complaint of right wrist pain.  Has seen primary care provider.  Has been diagnosed with the potential for de Quervain's tenosynovitis.  Given oral anti-inflammatories and has discussed bracing.  Patient states she has limited ROM and weakness.  Onset- January Location- Thumb  Duration-  Character- Sharp Aggravating factors- Movement, writing, brushing teeth, supination, circumduction, abdution Reliving factors- Aleve Therapies tried-Aleve, icing and bracing Severity-6 out of 10 and not improving     Past Medical History:  Diagnosis Date  . Arthritis    Neck, left ankle, clavicle  . Chickenpox 1987  . Chronic constipation   . Costochondritis   . Depression with somatization   . Erythema nodosum   . Fibroid uterus   . Gastritis 2018  . GERD (gastroesophageal reflux disease)   . Hemorrhoid   . History of PCR DNA positive for HSV1    buttocks area  . Psoriasis   . Rosacea   . Stomach ulcer 2007   Past Surgical History:  Procedure Laterality Date  . COLONOSCOPY WITH ESOPHAGOGASTRODUODENOSCOPY (EGD)  2007, 2008   Patient reports colonoscopy was unsuccessful secondary to her not being able to tolerate prep. Was being completed for chronic constipation.  . ESOPHAGOGASTRODUODENOSCOPY  06/28/2016   gastritis; gastric antrum  . SALPINGECTOMY Right 2004   ectopic pregnancy  . TONSILLECTOMY  2004   Social History   Socioeconomic History  . Marital status: Single    Spouse name: Not on file  . Number of children: Not on file  . Years of education: Not on file  . Highest education level: Not on file  Occupational History  . Not on file  Social Needs  . Financial resource strain: Not on file  . Food  insecurity:    Worry: Not on file    Inability: Not on file  . Transportation needs:    Medical: Not on file    Non-medical: Not on file  Tobacco Use  . Smoking status: Never Smoker  . Smokeless tobacco: Never Used  Substance and Sexual Activity  . Alcohol use: No    Frequency: Never  . Drug use: Yes  . Sexual activity: Never    Partners: Male    Birth control/protection: Implant    Comment: nexplanon 09/2015  Lifestyle  . Physical activity:    Days per week: Not on file    Minutes per session: Not on file  . Stress: Not on file  Relationships  . Social connections:    Talks on phone: Not on file    Gets together: Not on file    Attends religious service: Not on file    Active member of club or organization: Not on file    Attends meetings of clubs or organizations: Not on file    Relationship status: Not on file  Other Topics Concern  . Not on file  Social History Narrative   Single. Bachelor's degree. Works as a traveling Therapist, sports.   Wears a bicycle helmet.   Vegetarian diet.   Drinks caffeine.   Smoke alarm in the home. Wears her seatbelt.   Feels safe in her relationships.   Allergies  Allergen Reactions  . Molds & Smuts Shortness Of Breath  Family History  Problem Relation Age of Onset  . Gout Mother   . Rheum arthritis Mother   . Osteoarthritis Mother   . Breast cancer Mother   . Depression Mother   . Coronary artery disease Mother   . Hyperlipidemia Mother   . Hypertension Mother   . Kidney disease Mother   . Alcohol abuse Father   . Arthritis Father   . Hearing loss Father   . Hyperlipidemia Father   . Hypertension Father   . Psoriasis Father   . Arthritis Brother   . Hyperlipidemia Brother   . Hypertension Brother   . Psoriasis Brother   . Breast cancer Maternal Grandmother   . Heart murmur Maternal Grandmother   . Breast cancer Paternal Grandmother   . Psoriasis Paternal Grandmother   . Dementia Paternal Grandmother   . Heart attack Paternal  Grandfather   . Stroke Paternal Grandfather   . Coronary artery disease Paternal Grandfather      Past medical history, social, surgical and family history all reviewed in electronic medical record.  No pertanent information unless stated regarding to the chief complaint.   Review of Systems:Review of systems updated and as accurate as of 05/16/17  No headache, visual changes, nausea, vomiting, diarrhea, constipation, dizziness, abdominal pain, skin rash, fevers, chills, night sweats, weight loss, swollen lymph nodes, body aches, joint swelling, muscle aches, chest pain, shortness of breath, mood changes.   Objective  Blood pressure 124/78, pulse 79, height 5\' 5"  (1.651 m), SpO2 98 %. Systems examined below as of 05/16/17   General: No apparent distress alert and oriented x3 mood and affect normal, dressed appropriately.  HEENT: Pupils equal, extraocular movements intact  Respiratory: Patient's speak in full sentences and does not appear short of breath  Cardiovascular: No lower extremity edema, non tender, no erythema  Skin: Warm dry intact with no signs of infection or rash on extremities or on axial skeleton.  Abdomen: Soft nontender  Neuro: Cranial nerves II through XII are intact, neurovascularly intact in all extremities with 2+ DTRs and 2+ pulses.  Lymph: No lymphadenopathy of posterior or anterior cervical chain or axillae bilaterally.  Gait normal with good balance and coordination.  MSK:  Non tender with full range of motion and good stability and symmetric strength and tone of shoulders, elbows, hip, knee and ankles bilaterally.  Wrist: Right Inspection normal with no visible erythema or swelling. Range of motion lacks last 10 degrees of extension Severe tenderness over the distal radius noted. No snuffbox tenderness. No tenderness over Canal of [place]. Strength 5/5 in all directions without pain. Negative Finkelstein, tinel's and phalens. Negative Watson's test. Lateral  wrist unremarkable  MSK US performed of: Right wrist This study was ordered, performed, and interpreted by Charlann Boxer D.O.  Wrist: Abductor pollicis longus tendon she does have an effusion noted.  Significant increase in Doppler flow but going to the surrounding and underlying bone.  Patient does have callus formation over the distal radius.  IMPRESSION: Distal radius fracture with reactive tenosynovitis    Impression and Recommendations:     This case required medical decision making of moderate complexity.      Note: This dictation was prepared with Dragon dictation along with smaller phrase technology. Any transcriptional errors that result from this process are unintentional.

## 2017-05-16 ENCOUNTER — Ambulatory Visit (INDEPENDENT_AMBULATORY_CARE_PROVIDER_SITE_OTHER)
Admission: RE | Admit: 2017-05-16 | Discharge: 2017-05-16 | Disposition: A | Discharge status: Home / Self Care | Payer: No Typology Code available for payment source | Source: Ambulatory Visit | Attending: Family Medicine | Admitting: Family Medicine

## 2017-05-16 ENCOUNTER — Ambulatory Visit: Payer: No Typology Code available for payment source | Admitting: Family Medicine

## 2017-05-16 ENCOUNTER — Encounter: Payer: Self-pay | Admitting: Family Medicine

## 2017-05-16 ENCOUNTER — Ambulatory Visit: Payer: Self-pay

## 2017-05-16 VITALS — BP 124/78 | HR 79 | Ht 65.0 in

## 2017-05-16 DIAGNOSIS — S52551A Other extraarticular fracture of lower end of right radius, initial encounter for closed fracture: Secondary | ICD-10-CM

## 2017-05-16 DIAGNOSIS — M25531 Pain in right wrist: Secondary | ICD-10-CM

## 2017-05-16 DIAGNOSIS — S52501A Unspecified fracture of the lower end of right radius, initial encounter for closed fracture: Secondary | ICD-10-CM | POA: Insufficient documentation

## 2017-05-16 HISTORY — DX: Unspecified fracture of the lower end of right radius, initial encounter for closed fracture: S52.501A

## 2017-05-16 MED ORDER — VITAMIN D (ERGOCALCIFEROL) 1.25 MG (50000 UNIT) PO CAPS: 50000.0000 [IU] | ORAL_CAPSULE | ORAL | 0 refills | Status: DC

## 2017-05-16 MED FILL — VIT D2 1.25 MG (50,000 UNIT: 1.25 MG | 84 days supply | Qty: 12 | Fill #0

## 2017-05-16 NOTE — Assessment & Plan Note (Signed)
Patient does have more of a distal radius fracture.  Seems to have a callus formation with no significant displacement.  Increasing Doppler flow noted.  X-rays pending.  Discussed bracing again for another 2 weeks and no immobilization.  Topical anti-inflammatories given and once weekly vitamin D to aid in healing.  Follow-up with me again in 2 weeks.  Light duty at this time at work.

## 2017-05-16 NOTE — Patient Instructions (Signed)
Good to see you.  Ice 20 minutes 2 times daily. Usually after activity and before bed. pennsaid pinkie amount topically 2 times daily as needed.  Wear the brace day and night for the next 2 weeks.  No lifting with that arm at all  If you need another brace look at thumb spica wrist brace on [employer].  See me again in 2-3 weeks

## 2017-05-21 ENCOUNTER — Telehealth: Payer: Self-pay | Admitting: Family Medicine

## 2017-05-21 NOTE — Telephone Encounter (Signed)
Copied from [place] [phone]. Topic: Quick Communication - See Telephone Encounter >> May 21, 2017  4:14 PM Boyd Kerbs wrote: CRM for notification.   Pt. Calling wanting to know about the xray results from last Thursday. Said her  right thumb and wrist she can not move and is hurting.    Please call her back  See Telephone encounter for: 05/21/17.

## 2017-05-22 NOTE — Telephone Encounter (Signed)
Left message for patient to call back for x-ray results. 

## 2017-05-23 NOTE — Telephone Encounter (Signed)
Spoke with patient about xray results.  

## 2017-05-29 NOTE — Progress Notes (Signed)
[facility] [address], [place] [address] Phone: [phone] Subjective:    I'm seeing this patient by the request  of:    CC: Wrist pain follow-up  VVO:[id number]  Mary [name] is a 47 y.o. female coming in with complaint of wrist pain. She does not feel that she is making improvement. Her 3rd and 4th finger feel like she has jammed them but has not hit them on anything. Patient also has shooting pain in the 2nd finger. Patient has been wearing brace since last visit on a regular basis. She has not been able to keep to her 15# lifting restriction.  Patient states that it is just tender overall.  Wondering why this continues to give her such discomfort.  Patient did have x-rays.  These were independently visualized by me showing what appeared to be more of an avulsion of the radiocarpal joint.  Over read by radiology: Degenerative changes.    Past Medical History:  Diagnosis Date  . Arthritis    Neck, left ankle, clavicle  . Chickenpox 1987  . Chronic constipation   . Costochondritis   . Depression with somatization   . Erythema nodosum   . Fibroid uterus   . Gastritis 2018  . GERD (gastroesophageal reflux disease)   . Hemorrhoid   . History of PCR DNA positive for HSV1    buttocks area  . Psoriasis   . Rosacea   . Stomach ulcer 2007   Past Surgical History:  Procedure Laterality Date  . COLONOSCOPY WITH ESOPHAGOGASTRODUODENOSCOPY (EGD)  2007, 2008   Patient reports colonoscopy was unsuccessful secondary to her not being able to tolerate prep. Was being completed for chronic constipation.  . ESOPHAGOGASTRODUODENOSCOPY  06/28/2016   gastritis; gastric antrum  . SALPINGECTOMY Right 2004   ectopic pregnancy  . TONSILLECTOMY  2004   Social History   Socioeconomic History  . Marital status: Single    Spouse name: Not on file  . Number of children: Not on file  . Years of education: Not on file  . Highest education level: Not  on file  Occupational History  . Not on file  Social Needs  . Financial resource strain: Not on file  . Food insecurity:    Worry: Not on file    Inability: Not on file  . Transportation needs:    Medical: Not on file    Non-medical: Not on file  Tobacco Use  . Smoking status: Never Smoker  . Smokeless tobacco: Never Used  Substance and Sexual Activity  . Alcohol use: No    Frequency: Never  . Drug use: Yes  . Sexual activity: Never    Partners: Male    Birth control/protection: Implant    Comment: nexplanon 09/2015  Lifestyle  . Physical activity:    Days per week: Not on file    Minutes per session: Not on file  . Stress: Not on file  Relationships  . Social connections:    Talks on phone: Not on file    Gets together: Not on file    Attends religious service: Not on file    Active member of club or organization: Not on file    Attends meetings of clubs or organizations: Not on file    Relationship status: Not on file  Other Topics Concern  . Not on file  Social History Narrative   Single. Bachelor's degree. Works as a traveling Therapist, sports.   Wears a bicycle helmet.  Vegetarian diet.   Drinks caffeine.   Smoke alarm in the home. Wears her seatbelt.   Feels safe in her relationships.   Allergies  Allergen Reactions  . Molds & Smuts Shortness Of Breath   Family History  Problem Relation Age of Onset  . Gout Mother   . Rheum arthritis Mother   . Osteoarthritis Mother   . Breast cancer Mother   . Depression Mother   . Coronary artery disease Mother   . Hyperlipidemia Mother   . Hypertension Mother   . Kidney disease Mother   . Alcohol abuse Father   . Arthritis Father   . Hearing loss Father   . Hyperlipidemia Father   . Hypertension Father   . Psoriasis Father   . Arthritis Brother   . Hyperlipidemia Brother   . Hypertension Brother   . Psoriasis Brother   . Breast cancer Maternal Grandmother   . Heart murmur Maternal Grandmother   . Breast cancer  Paternal Grandmother   . Psoriasis Paternal Grandmother   . Dementia Paternal Grandmother   . Heart attack Paternal Grandfather   . Stroke Paternal Grandfather   . Coronary artery disease Paternal Grandfather      Past medical history, social, surgical and family history all reviewed in electronic medical record.  No pertanent information unless stated regarding to the chief complaint.   Review of Systems:Review of systems updated and as accurate as of 05/30/17  No headache, visual changes, nausea, vomiting, diarrhea, constipation, dizziness, abdominal pain, skin rash, fevers, chills, night sweats, weight loss, swollen lymph nodes, body aches, joint swelling, muscle aches, chest pain, shortness of breath, mood changes.   Objective  Blood pressure 110/80, pulse 78, height 5\' 5"  (1.651 m), SpO2 98 %. Systems examined below as of 05/30/17   General: No apparent distress alert and oriented x3 mood and affect normal, dressed appropriately.  HEENT: Pupils equal, extraocular movements intact  Respiratory: Patient's speak in full sentences and does not appear short of breath  Cardiovascular: No lower extremity edema, non tender, no erythema  Skin: Warm dry intact with no signs of infection or rash on extremities or on axial skeleton.  Abdomen: Soft nontender  Neuro: Cranial nerves II through XII are intact, neurovascularly intact in all extremities with 2+ DTRs and 2+ pulses.  Lymph: No lymphadenopathy of posterior or anterior cervical chain or axillae bilaterally.  Gait normal with good balance and coordination.  MSK:  Non tender with full range of motion and good stability and symmetric strength and tone of shoulders, elbows,  hip, knee and ankles bilaterally.  Wrist: Right Inspection normal with no visible erythema or swelling. Near full range of motion.  Patient does have tenderness to palpation over the distal radius.  TFCC; tendons without tenderness/ swelling No snuffbox tenderness. No  tenderness over Canal of [place]. Strength 5/5 in all directions without pain. Very mild positive Finkelstein, negative Tinel's and phalens. Negative Watson's test. Contralateral wrist unremarkable  MSK US performed of: Right wrist This study was ordered, performed, and interpreted by Charlann Boxer D.O.  Wrist: Patient does have some peeling noted over the distal radial bone.  Patient though does have a reactive tenosynovitis of the abductor pollicis longus.  Significant increase in Doppler flow in the area though.  IMPRESSION: Partial tearing of the abductor pollicis longus with reactive tenosynovitis    Impression and Recommendations:     This case required medical decision making of moderate complexity.      Note: This dictation  was prepared with Dragon dictation along with smaller phrase technology. Any transcriptional errors that result from this process are unintentional.

## 2017-05-30 ENCOUNTER — Encounter: Payer: Self-pay | Admitting: Family Medicine

## 2017-05-30 ENCOUNTER — Ambulatory Visit: Payer: No Typology Code available for payment source | Admitting: Family Medicine

## 2017-05-30 ENCOUNTER — Ambulatory Visit: Payer: Self-pay

## 2017-05-30 VITALS — BP 110/80 | HR 78 | Ht 65.0 in

## 2017-05-30 DIAGNOSIS — M25531 Pain in right wrist: Secondary | ICD-10-CM

## 2017-05-30 DIAGNOSIS — S52551D Other extraarticular fracture of lower end of right radius, subsequent encounter for closed fracture with routine healing: Secondary | ICD-10-CM | POA: Diagnosis not present

## 2017-05-30 MED ORDER — NITROGLYCERIN 0.2 MG/HR TD PT24: MEDICATED_PATCH | TRANSDERMAL | 1 refills | Status: DC

## 2017-05-30 MED FILL — NITROGLYCERIN 0.2 MG/HR PTC: 0.2 | 90 days supply | Qty: 23 | Fill #0

## 2017-05-30 MED FILL — BuPROPion HCL ER (XL) 300 M: 300 | 90 days supply | Qty: 90 | Fill #0

## 2017-05-30 NOTE — Assessment & Plan Note (Signed)
Patient does have a distal radius fracture what appears to be a partial tear noted.  Discussed the possibility of injection.  Patient declined this and decided on nitroglycerin.  We discussed which activities of doing which wants to avoid.  Patient was to increase activity slowly.  Topical anti-inflammatories.  Follow-up again in 3-4 weeks

## 2017-05-30 NOTE — Patient Instructions (Signed)
Good to see you  nitroglycerin Protocol   Apply 1/4 nitroglycerin patch to affected area daily.  Change position of patch within the affected area every 24 hours.  You may experience a headache during the first 1-2 weeks of using the patch, these should subside.  If you experience headaches after beginning nitroglycerin patch treatment, you may take your preferred over the counter pain reliever.  Another side effect of the nitroglycerin patch is skin irritation or rash related to patch adhesive.  Please notify our office if you develop more severe headaches or rash, and stop the patch.  Tendon healing with nitroglycerin patch may require 12 to 24 weeks depending on the extent of injury.  Men should not use if taking Viagra, Cialis, or Levitra.   Do not use if you have migraines or rosacea. Ice is still your friend.  Move it around out of the brace when you can  Exercises 3 times a week.  See me again in 4 weeks

## 2017-06-19 ENCOUNTER — Telehealth: Payer: Self-pay | Admitting: Family Medicine

## 2017-06-19 ENCOUNTER — Other Ambulatory Visit: Payer: Self-pay

## 2017-06-19 MED ORDER — DICLOFENAC SODIUM 2 % TD SOLN: 2.0000 g | Freq: Two times a day (BID) | TRANSDERMAL | 3 refills | Status: DC

## 2017-06-19 NOTE — Telephone Encounter (Signed)
Copied from [place] [id number]. Topic: Quick Communication - See Telephone Encounter >> Jun 19, 2017  2:19 PM Ether Griffins B wrote: CRM for notification. See Telephone encounter for: 06/19/17.  Pt was given a sample of Voltaren Gel and would like this called in to [facility], [place].

## 2017-06-19 NOTE — Telephone Encounter (Signed)
Spoke with patient who would like more pennsaid not voltaren. Patient is somewhat demanding and states that she needs the medication now and asks why she needs to wait for the medication to come from [facility]. Let patient know that medication is very expensive at her local pharmacy and that she will get the pennsaid for less than $10 from [facility]. Patient requests samples as she states that she cannot wait for bottle to get here. 2 boxes of pennsaid placed at front desk for patient.

## 2017-06-20 ENCOUNTER — Encounter: Payer: Self-pay | Admitting: *Deleted

## 2017-06-20 ENCOUNTER — Ambulatory Visit (INDEPENDENT_AMBULATORY_CARE_PROVIDER_SITE_OTHER): Payer: No Typology Code available for payment source | Admitting: Family Medicine

## 2017-06-20 ENCOUNTER — Encounter: Payer: Self-pay | Admitting: Family Medicine

## 2017-06-20 VITALS — BP 119/80 | HR 76 | Temp 98.1°F | Resp 20 | Ht 65.0 in | Wt 162.5 lb

## 2017-06-20 DIAGNOSIS — Z131 Encounter for screening for diabetes mellitus: Secondary | ICD-10-CM

## 2017-06-20 DIAGNOSIS — D229 Melanocytic nevi, unspecified: Secondary | ICD-10-CM | POA: Diagnosis not present

## 2017-06-20 DIAGNOSIS — E663 Overweight: Secondary | ICD-10-CM | POA: Diagnosis not present

## 2017-06-20 DIAGNOSIS — Z975 Presence of (intrauterine) contraceptive device: Secondary | ICD-10-CM

## 2017-06-20 DIAGNOSIS — F329 Major depressive disorder, single episode, unspecified: Secondary | ICD-10-CM | POA: Diagnosis not present

## 2017-06-20 DIAGNOSIS — Z0000 Encounter for general adult medical examination without abnormal findings: Secondary | ICD-10-CM | POA: Diagnosis not present

## 2017-06-20 DIAGNOSIS — F450 Somatization disorder: Secondary | ICD-10-CM | POA: Diagnosis not present

## 2017-06-20 DIAGNOSIS — Z79899 Other long term (current) drug therapy: Secondary | ICD-10-CM

## 2017-06-20 LAB — LIPID PANEL
Cholesterol: 175 mg/dL (ref 0–200)
HDL: 46.8 mg/dL (ref >39.00)
LDL Cholesterol: 119 mg/dL — ABNORMAL HIGH (ref 0–99)
NONHDL: 128.34
Total CHOL/HDL Ratio: 4
Triglycerides: 46 mg/dL (ref 0.0–149.0)
VLDL: 9.2 mg/dL (ref 0.0–40.0)

## 2017-06-20 LAB — CBC WITH DIFFERENTIAL/PLATELET
BASOS ABS: 0.1 10*3/uL (ref 0.0–0.1)
Basophils Relative: 1.5 % (ref 0.0–3.0)
Eosinophils Absolute: 0.3 10*3/uL (ref 0.0–0.7)
Eosinophils Relative: 4.8 % (ref 0.0–5.0)
HCT: 43.3 % (ref 36.0–46.0)
HEMOGLOBIN: 14.7 g/dL (ref 12.0–15.0)
LYMPHS ABS: 1.6 10*3/uL (ref 0.7–4.0)
Lymphocytes Relative: 29.2 % (ref 12.0–46.0)
MCHC: 33.9 g/dL (ref 30.0–36.0)
MCV: 87.8 fl (ref 78.0–100.0)
MONO ABS: 0.4 10*3/uL (ref 0.1–1.0)
MONOS PCT: 7 % (ref 3.0–12.0)
NEUTROS PCT: 57.5 % (ref 43.0–77.0)
Neutro Abs: 3.2 10*3/uL (ref 1.4–7.7)
Platelets: 193 10*3/uL (ref 150.0–400.0)
RBC: 4.93 Mil/uL (ref 3.87–5.11)
RDW: 13.2 % (ref 11.5–15.5)
WBC: 5.6 10*3/uL (ref 4.0–10.5)

## 2017-06-20 LAB — COMPREHENSIVE METABOLIC PANEL
ALBUMIN: 4.2 g/dL (ref 3.5–5.2)
ALK PHOS: 47 U/L (ref 39–117)
ALT: 9 U/L (ref 0–35)
AST: 10 U/L (ref 0–37)
BILIRUBIN TOTAL: 0.3 mg/dL (ref 0.2–1.2)
BUN: 16 mg/dL (ref 6–23)
CO2: 26 mEq/L (ref 19–32)
CREATININE: 0.92 mg/dL (ref 0.40–1.20)
Calcium: 9.2 mg/dL (ref 8.4–10.5)
Chloride: 107 mEq/L (ref 96–112)
GFR: 69.12 mL/min (ref >60.00)
GLUCOSE: 100 mg/dL — AB (ref 70–99)
Potassium: 4.3 mEq/L (ref 3.5–5.1)
Sodium: 140 mEq/L (ref 135–145)
TOTAL PROTEIN: 6.5 g/dL (ref 6.0–8.3)

## 2017-06-20 LAB — HEMOGLOBIN A1C: Hgb A1c MFr Bld: 5.1 % (ref 4.6–6.5)

## 2017-06-20 LAB — TSH: TSH: 2.45 u[IU]/mL (ref 0.35–4.50)

## 2017-06-20 MED ORDER — SERTRALINE HCL 50 MG PO TABS: 50.0000 mg | ORAL_TABLET | Freq: Every day | ORAL | 1 refills | Status: DC

## 2017-06-20 MED ORDER — BUPROPION HCL ER (XL) 300 MG PO TB24: 300.0000 mg | ORAL_TABLET | Freq: Every day | ORAL | 1 refills | Status: DC

## 2017-06-20 NOTE — Patient Instructions (Signed)
I have refilled your meds, increased your zoloft and placed referral to Psychology.   Follow up in 6 months if doing well on new dose. If not followup in 4 weeks. Please have new GYN to send [place] the pap and mammogram results.    Health Maintenance, Female Adopting a healthy lifestyle and getting preventive care can go a long way to promote health and wellness. Talk with your health care provider about what schedule of regular examinations is right for you. This is a good chance for you to check in with your provider about disease prevention and staying healthy. In between checkups, there are plenty of things you can do on your own. Experts have done a lot of research about which lifestyle changes and preventive measures are most likely to keep you healthy. Ask your health care provider for more information. Weight and diet Eat a healthy diet  Be sure to include plenty of vegetables, fruits, low-fat dairy products, and lean protein.  Do not eat a lot of foods high in solid fats, added sugars, or salt.  Get regular exercise. This is one of the most important things you can do for your health. ? Most adults should exercise for at least 150 minutes each week. The exercise should increase your heart rate and make you sweat (moderate-intensity exercise). ? Most adults should also do strengthening exercises at least twice a week. This is in addition to the moderate-intensity exercise.  Maintain a healthy weight  Body mass index (BMI) is a measurement that can be used to identify possible weight problems. It estimates body fat based on height and weight. Your health care provider can help determine your BMI and help you achieve or maintain a healthy weight.  For females 67 years of age and older: ? A BMI below 18.5 is considered underweight. ? A BMI of 18.5 to 24.9 is normal. ? A BMI of 25 to 29.9 is considered overweight. ? A BMI of 30 and above is considered obese.  Watch levels of cholesterol  and blood lipids  You should start having your blood tested for lipids and cholesterol at 49 years of age, then have this test every 5 years.  You may need to have your cholesterol levels checked more often if: ? Your lipid or cholesterol levels are high. ? You are older than 49 years of age. ? You are at high risk for heart disease.  Cancer screening Lung Cancer  Lung cancer screening is recommended for adults 21-65 years old who are at high risk for lung cancer because of a history of smoking.  A yearly low-dose CT scan of the lungs is recommended for people who: ? Currently smoke. ? Have quit within the past 15 years. ? Have at least a 30-pack-year history of smoking. A pack year is smoking an average of one pack of cigarettes a day for 1 year.  Yearly screening should continue until it has been 15 years since you quit.  Yearly screening should stop if you develop a health problem that would prevent you from having lung cancer treatment.  Breast Cancer  Practice breast self-awareness. This means understanding how your breasts normally appear and feel.  It also means doing regular breast self-exams. Let your health care provider know about any changes, no matter how small.  If you are in your 20s or 30s, you should have a clinical breast exam (CBE) by a health care provider every 1-3 years as part of a regular health exam.  If you are 40 or older, have a CBE every year. Also consider having a breast X-ray (mammogram) every year.  If you have a family history of breast cancer, talk to your health care provider about genetic screening.  If you are at high risk for breast cancer, talk to your health care provider about having an MRI and a mammogram every year.  Breast cancer gene (BRCA) assessment is recommended for women who have family members with BRCA-related cancers. BRCA-related cancers include: ? Breast. ? Ovarian. ? Tubal. ? Peritoneal cancers.  Results of the  assessment will determine the need for genetic counseling and BRCA1 and BRCA2 testing.  Cervical Cancer Your health care provider may recommend that you be screened regularly for cancer of the pelvic organs (ovaries, uterus, and vagina). This screening involves a pelvic examination, including checking for microscopic changes to the surface of your cervix (Pap test). You may be encouraged to have this screening done every 3 years, beginning at age 71.  For women ages 48-65, health care providers may recommend pelvic exams and Pap testing every 3 years, or they may recommend the Pap and pelvic exam, combined with testing for human papilloma virus (HPV), every 5 years. Some types of HPV increase your risk of cervical cancer. Testing for HPV may also be done on women of any age with unclear Pap test results.  Other health care providers may not recommend any screening for nonpregnant women who are considered low risk for pelvic cancer and who do not have symptoms. Ask your health care provider if a screening pelvic exam is right for you.  If you have had past treatment for cervical cancer or a condition that could lead to cancer, you need Pap tests and screening for cancer for at least 20 years after your treatment. If Pap tests have been discontinued, your risk factors (such as having a new sexual partner) need to be reassessed to determine if screening should resume. Some women have medical problems that increase the chance of getting cervical cancer. In these cases, your health care provider may recommend more frequent screening and Pap tests.  Colorectal Cancer  This type of cancer can be detected and often prevented.  Routine colorectal cancer screening usually begins at 49 years of age and continues through 49 years of age.  Your health care provider may recommend screening at an earlier age if you have risk factors for colon cancer.  Your health care provider may also recommend using home test  kits to check for hidden blood in the stool.  A small camera at the end of a tube can be used to examine your colon directly (sigmoidoscopy or colonoscopy). This is done to check for the earliest forms of colorectal cancer.  Routine screening usually begins at age 37.  Direct examination of the colon should be repeated every 5-10 years through 49 years of age. However, you may need to be screened more often if early forms of precancerous polyps or small growths are found.  Skin Cancer  Check your skin from head to toe regularly.  Tell your health care provider about any new moles or changes in moles, especially if there is a change in a mole's shape or color.  Also tell your health care provider if you have a mole that is larger than the size of a pencil eraser.  Always use sunscreen. Apply sunscreen liberally and repeatedly throughout the day.  Protect yourself by wearing long sleeves, pants, a wide-brimmed hat, and  sunglasses whenever you are outside.  Heart disease, diabetes, and high blood pressure  High blood pressure causes heart disease and increases the risk of stroke. High blood pressure is more likely to develop in: ? People who have blood pressure in the high end of the normal range (130-139/85-89 mm Hg). ? People who are overweight or obese. ? People who are African American.  If you are 18-39 years of age, have your blood pressure checked every 3-5 years. If you are 40 years of age or older, have your blood pressure checked every year. You should have your blood pressure measured twice-once when you are at a hospital or clinic, and once when you are not at a hospital or clinic. Record the average of the two measurements. To check your blood pressure when you are not at a hospital or clinic, you can use: ? An automated blood pressure machine at a pharmacy. ? A home blood pressure monitor.  If you are between 55 years and 79 years old, ask your health care provider if you  should take aspirin to prevent strokes.  Have regular diabetes screenings. This involves taking a blood sample to check your fasting blood sugar level. ? If you are at a normal weight and have a low risk for diabetes, have this test once every three years after 49 years of age. ? If you are overweight and have a high risk for diabetes, consider being tested at a younger age or more often. Preventing infection Hepatitis B  If you have a higher risk for hepatitis B, you should be screened for this virus. You are considered at high risk for hepatitis B if: ? You were born in a country where hepatitis B is common. Ask your health care provider which countries are considered high risk. ? Your parents were born in a high-risk country, and you have not been immunized against hepatitis B (hepatitis B vaccine). ? You have HIV or AIDS. ? You use needles to inject street drugs. ? You live with someone who has hepatitis B. ? You have had sex with someone who has hepatitis B. ? You get hemodialysis treatment. ? You take certain medicines for conditions, including cancer, organ transplantation, and autoimmune conditions.  Hepatitis C  Blood testing is recommended for: ? Everyone born from 1945 through 1965. ? Anyone with known risk factors for hepatitis C.  Sexually transmitted infections (STIs)  You should be screened for sexually transmitted infections (STIs) including gonorrhea and chlamydia if: ? You are sexually active and are younger than 49 years of age. ? You are older than 49 years of age and your health care provider tells you that you are at risk for this type of infection. ? Your sexual activity has changed since you were last screened and you are at an increased risk for chlamydia or gonorrhea. Ask your health care provider if you are at risk.  If you do not have HIV, but are at risk, it may be recommended that you take a prescription medicine daily to prevent HIV infection. This is  called pre-exposure prophylaxis (PrEP). You are considered at risk if: ? You are sexually active and do not regularly use condoms or know the HIV status of your partner(s). ? You take drugs by injection. ? You are sexually active with a partner who has HIV.  Talk with your health care provider about whether you are at high risk of being infected with HIV. If you choose to begin PrEP, you   should first be tested for HIV. You should then be tested every 3 months for as long as you are taking PrEP. Pregnancy  If you are premenopausal and you may become pregnant, ask your health care provider about preconception counseling.  If you may become pregnant, take 400 to 800 micrograms (mcg) of folic acid every day.  If you want to prevent pregnancy, talk to your health care provider about birth control (contraception). Osteoporosis and menopause  Osteoporosis is a disease in which the bones lose minerals and strength with aging. This can result in serious bone fractures. Your risk for osteoporosis can be identified using a bone density scan.  If you are 58 years of age or older, or if you are at risk for osteoporosis and fractures, ask your health care provider if you should be screened.  Ask your health care provider whether you should take a calcium or vitamin D supplement to lower your risk for osteoporosis.  Menopause may have certain physical symptoms and risks.  Hormone replacement therapy may reduce some of these symptoms and risks. Talk to your health care provider about whether hormone replacement therapy is right for you. Follow these instructions at home:  Schedule regular health, dental, and eye exams.  Stay current with your immunizations.  Do not use any tobacco products including cigarettes, chewing tobacco, or electronic cigarettes.  If you are pregnant, do not drink alcohol.  If you are breastfeeding, limit how much and how often you drink alcohol.  Limit alcohol intake to  no more than 1 drink per day for nonpregnant women. One drink equals 12 ounces of beer, 5 ounces of wine, or 1 ounces of hard liquor.  Do not use street drugs.  Do not share needles.  Ask your health care provider for help if you need support or information about quitting drugs.  Tell your health care provider if you often feel depressed.  Tell your health care provider if you have ever been abused or do not feel safe at home. This information is not intended to replace advice given to you by your health care provider. Make sure you discuss any questions you have with your health care provider. Document Released: 08/21/2010 Document Revised: 07/14/2015 Document Reviewed: 11/09/2014 Elsevier Interactive Patient Education  [employer].

## 2017-06-20 NOTE — Progress Notes (Signed)
Patient ID: Mary [name], female  DOB: [birth year], 47 y.o.   MRN: [id number] Patient Care Team    Relationship Specialty Notifications Start End  Ma Hillock, DO PCP - General Family Medicine  03/12/17   Leanora Ivanoff, MD Referring Physician Obstetrics  03/12/17    Comment: Unlimited referrals, established gynecologist    Chief Complaint  Patient presents with  . Annual Exam    Subjective:  Mary [name] is a 47 y.o.  Female  present for CPE. All past medical history, surgical history, allergies, family history, immunizations, medications and social history were updated in the electronic medical record today. All recent labs, ED visits and hospitalizations within the last year were reviewed.  Health maintenance:  Colonoscopy: No fhx. Routine screening at 71. Mammogram: completed:10/24/2016, birads 1, [facility] Cervical cancer screening: last pap: 03/24/2015, results: normal, neg co-test. Has appt this month for GYN with Dr. Talbert Nan Immunizations: tdap UTD 2017, Influenza UTD 2018 (encouraged yearly) Infectious disease screening: HIV completed DEXA: routine screen Assistive device: none Oxygen KZL:DJTT Patient has a Dental home. Hospitalizations/ED visits: reviewed  Depression screen [facility] 2/9 06/20/2017 03/12/2017 03/12/2017  Decreased Interest 2 1 0  Down, Depressed, Hopeless 2 0 0  PHQ - 2 Score 4 1 0  Altered sleeping 2 0 -  Tired, decreased energy 2 1 -  Change in appetite 2 1 -  Feeling bad or failure about yourself  1 0 -  Trouble concentrating 0 0 -  Moving slowly or fidgety/restless 0 0 -  Suicidal thoughts 0 0 -  PHQ-9 Score 11 3 -  Difficult doing work/chores - Not difficult at all -   GAD 7 : Generalized Anxiety Score 06/20/2017 03/12/2017  Nervous, Anxious, on Edge 0 0  Control/stop worrying 0 0  Worry too much - different things 0 0  Trouble relaxing 2 0  Restless 1 0  Easily annoyed or irritable 0 0  Afraid - awful might happen 0 0  Total GAD  7 Score 3 0  Anxiety Difficulty - Not difficult at all     Current Exercise Habits: The patient does not participate in regular exercise at present Exercise limited by: None identified   Immunization History  Administered Date(s) Administered  . Influenza,inj,Quad PF,6+ Mos 03/12/2016  . Influenza,inj,quad, With Preservative 12/07/2014  . Influenza-Unspecified 11/14/2015  . Tdap 07/26/2014, 02/20/2015     Past Medical History:  Diagnosis Date  . Arthritis    Neck, left ankle, clavicle  . Chickenpox 1987  . Chronic constipation   . Costochondritis   . Depression with somatization   . Erythema nodosum   . Fibroid uterus   . Gastritis 2018  . GERD (gastroesophageal reflux disease)   . Hemorrhoid   . History of PCR DNA positive for HSV1    buttocks area  . Psoriasis   . Rosacea   . Stomach ulcer 2007   Allergies  Allergen Reactions  . Molds & Smuts Shortness Of Breath   Past Surgical History:  Procedure Laterality Date  . COLONOSCOPY WITH ESOPHAGOGASTRODUODENOSCOPY (EGD)  2007, 2008   Patient reports colonoscopy was unsuccessful secondary to her not being able to tolerate prep. Was being completed for chronic constipation.  . ESOPHAGOGASTRODUODENOSCOPY  06/28/2016   gastritis; gastric antrum  . SALPINGECTOMY Right 2004   ectopic pregnancy  . TONSILLECTOMY  2004   Family History  Problem Relation Age of Onset  . Gout Mother   . Rheum arthritis Mother   .  Osteoarthritis Mother   . Breast cancer Mother   . Depression Mother   . Coronary artery disease Mother   . Hyperlipidemia Mother   . Hypertension Mother   . Kidney disease Mother   . Alcohol abuse Father   . Arthritis Father   . Hearing loss Father   . Hyperlipidemia Father   . Hypertension Father   . Psoriasis Father   . Arthritis Brother   . Hyperlipidemia Brother   . Hypertension Brother   . Psoriasis Brother   . Breast cancer Maternal Grandmother   . Heart murmur Maternal Grandmother   .  Breast cancer Paternal Grandmother   . Psoriasis Paternal Grandmother   . Dementia Paternal Grandmother   . Heart attack Paternal Grandfather   . Stroke Paternal Grandfather   . Coronary artery disease Paternal Grandfather    Social History   Socioeconomic History  . Marital status: Single    Spouse name: Not on file  . Number of children: Not on file  . Years of education: Not on file  . Highest education level: Not on file  Occupational History  . Not on file  Social Needs  . Financial resource strain: Not on file  . Food insecurity:    Worry: Not on file    Inability: Not on file  . Transportation needs:    Medical: Not on file    Non-medical: Not on file  Tobacco Use  . Smoking status: Never Smoker  . Smokeless tobacco: Never Used  Substance and Sexual Activity  . Alcohol use: No    Frequency: Never  . Drug use: Yes  . Sexual activity: Never    Partners: Male    Birth control/protection: Implant    Comment: nexplanon 09/2015  Lifestyle  . Physical activity:    Days per week: Not on file    Minutes per session: Not on file  . Stress: Not on file  Relationships  . Social connections:    Talks on phone: Not on file    Gets together: Not on file    Attends religious service: Not on file    Active member of club or organization: Not on file    Attends meetings of clubs or organizations: Not on file    Relationship status: Not on file  . Intimate partner violence:    Fear of current or ex partner: Not on file    Emotionally abused: Not on file    Physically abused: Not on file    Forced sexual activity: Not on file  Other Topics Concern  . Not on file  Social History Narrative   Single. Bachelor's degree. Works as a traveling Therapist, sports.   Wears a bicycle helmet.   Vegetarian diet.   Drinks caffeine.   Smoke alarm in the home. Wears her seatbelt.   Feels safe in her relationships.   Allergies as of 06/20/2017      Reactions   Molds & Smuts Shortness Of Breath        Medication List        Accurate as of 06/20/17  8:59 AM. Always use your most recent med list.          ALREX 0.2 % Susp Generic drug:  loteprednol 1 drop 2 (two) times daily as needed.   buPROPion 300 MG 24 hr tablet Commonly known as:  WELLBUTRIN XL Take 1 tablet (300 mg total) by mouth daily.   nitroGLYCERIN 0.2 mg/hr patch Commonly known as:  NITRODUR -  Dosed in mg/24 hr 1/4 patch daily   sertraline 50 MG tablet Commonly known as:  ZOLOFT Take 1 tablet (50 mg total) by mouth daily.   tretinoin 0.05 % cream Commonly known as:  RETIN-A Apply topically at bedtime.   valACYclovir 500 MG tablet Commonly known as:  VALTREX Take 1,000 mg by mouth 2 (two) times daily as needed.   Vitamin D (Ergocalciferol) 50000 units Caps capsule Commonly known as:  DRISDOL Take 1 capsule (50,000 Units total) by mouth every 7 (seven) days.       All past medical history, surgical history, allergies, family history, immunizations andmedications were updated in the EMR today and reviewed under the history and medication portions of their EMR.     No results found for this or any previous visit (from the past 2160 hour(s)).   ROS: 14 pt review of systems performed and negative (unless mentioned in an HPI)  Objective: BP 119/80 (BP Location: Left Arm, Patient Position: Sitting, Cuff Size: Normal)   Pulse 76   Temp 98.1 F (36.7 C)   Resp 20   Ht '5\' 5"'  (1.651 m)   Wt 162 lb 8 oz (73.7 kg)   SpO2 97%   BMI 27.04 kg/m  Gen: Afebrile. No acute distress. Nontoxic in appearance, well-developed, well-nourished,  Overweight, pleasant caucasian female.  HENT: AT. [place]. Bilateral TM visualized and normal in appearance, normal external auditory canal. MMM, no oral lesions, adequate dentition. Bilateral nares within normal limits. Throat without erythema, ulcerations or exudates. no Cough on exam, no hoarseness on exam. Eyes:Pupils Equal Round Reactive to light, Extraocular movements intact,   Conjunctiva without redness, discharge or icterus. Neck/lymp/endocrine: Supple,no lymphadenopathy, no thyromegaly CV: RRR no murmur, noedema, +2/4 P posterior tibialis pulses. Chest: CTAB, no wheeze, rhonchi or crackles. normal Respiratory effort. good Air movement. Abd: Soft. flat. NTND. BS present. no Masses palpated. No hepatosplenomegaly. No rebound tenderness or guarding. Skin: no rashes or purpura. Multiple petechiae and nevi over abdomen. Neuro/Msk:  Normal gait. PERLA. EOMi. Alert. Oriented x3.  Cranial nerves II through XII intact. Muscle strength 5/5 upper/lower extremity. DTRs equal bilaterally. Psych: Normal affect, dress and demeanor. Normal speech. Normal thought content and judgment.   No exam data present  Assessment/plan: Mary [name] is a 47 y.o. female present for CPE. Encounter for preventive health examination Patient was encouraged to exercise greater than 150 minutes a week. Patient was encouraged to choose a diet filled with fresh fruits and vegetables, and lean meats. AVS provided to patient today for education/recommendation on gender specific health and safety maintenance. Colonoscopy: No fhx. Routine screening at 5. Mammogram: completed:10/24/2016, birads 1, [facility] Cervical cancer screening: last pap: 03/24/2015, results: normal, neg co-test. Has appt this month for GYN with Dr. Talbert Nan Immunizations: tdap UTD 2017, Influenza UTD 2018 (encouraged yearly) Infectious disease screening: HIV completed DEXA: routine screen  Depression with somatization/Major depressive disorder, remission status unspecified, unspecified whether recurrent - increase in zoloft today from 25--> 50 mg. Continue Wellbutrin, refills provided.  - TSH - buPROPion (WELLBUTRIN XL) 300 MG 24 hr tablet; Take 1 tablet (300 mg total) by mouth daily.  Dispense: 90 tablet; Refill: 1 - sertraline (ZOLOFT) 50 MG tablet; Take 1 tablet (50 mg total) by mouth daily.  Dispense: 90 tablet; Refill:  1 - discussed psychology referral and she is agreeable to referral to day.  - referral to psych placed.  - f/u 6 mos if doing well. 4 weeks if not seeing improvement at higher dose.  Nexplanon in place Placed 09/2015. She has GYN appt set up Diabetes mellitus screening - HgB A1c Encounter for long-term current use of medication - CBC w/Diff - Comp Met (CMET) Overweight (BMI 25.0-29.9) - Lipid panel - routine exercise--> she is going to start cycling. Diet modifications.  Multiple pigmented nevi Pt encouraged to seek routine dermatology visits. If she would like or need a referral she will call in. Ok to place without appt     Return in about 6 months (around 12/21/2017) for depression. and 1 year for CPE  Electronically signed by: Howard Pouch, DO [place]

## 2017-06-27 MED ORDER — DICLOFENAC SODIUM 2 % TD SOLN: TRANSDERMAL | 3 refills | Status: DC

## 2017-06-27 NOTE — Telephone Encounter (Signed)
Sent RX into [facility] [facility]

## 2017-07-02 NOTE — Progress Notes (Signed)
[facility] [address] [place], [place] [address] Phone: [phone] Subjective:    CC: Wrist pain follow-up  [id number]  Mary [name] is a 47 y.o. female coming in with complaint of wrist pain.  Right wrist pain.  Found to have partial tearing of the de Quervain's tenosynovitis.  Patient initially had a distal radius fracture that seem to be healing.  Continues to have significant amount of pain.  Very minimal improvement if any since starting the nitroglycerin.  Does not have some headaches that are associated with the patch though.  Patient states continues to get underway of certain daily activities as well as her job.     Past Medical History:  Diagnosis Date  . Arthritis    Neck, left ankle, clavicle  . Chickenpox 1987  . Chronic constipation   . Costochondritis   . Depression with somatization   . Erythema nodosum   . Fibroid uterus   . Gastritis 2018  . GERD (gastroesophageal reflux disease)   . Hemorrhoid   . History of PCR DNA positive for HSV1    buttocks area  . Psoriasis   . Rosacea   . Stomach ulcer 2007   Past Surgical History:  Procedure Laterality Date  . COLONOSCOPY WITH ESOPHAGOGASTRODUODENOSCOPY (EGD)  2007, 2008   Patient reports colonoscopy was unsuccessful secondary to her not being able to tolerate prep. Was being completed for chronic constipation.  . ESOPHAGOGASTRODUODENOSCOPY  06/28/2016   gastritis; gastric antrum  . SALPINGECTOMY Right 2004   ectopic pregnancy  . TONSILLECTOMY  2004   Social History   Socioeconomic History  . Marital status: Single    Spouse name: Not on file  . Number of children: Not on file  . Years of education: Not on file  . Highest education level: Not on file  Occupational History  . Not on file  Social Needs  . Financial resource strain: Not on file  . Food insecurity:    Worry: Not on file    Inability: Not on file  . Transportation needs:    Medical: Not on file      Non-medical: Not on file  Tobacco Use  . Smoking status: Never Smoker  . Smokeless tobacco: Never Used  Substance and Sexual Activity  . Alcohol use: No    Frequency: Never  . Drug use: Yes  . Sexual activity: Never    Partners: Male    Birth control/protection: Implant    Comment: nexplanon 09/2015  Lifestyle  . Physical activity:    Days per week: Not on file    Minutes per session: Not on file  . Stress: Not on file  Relationships  . Social connections:    Talks on phone: Not on file    Gets together: Not on file    Attends religious service: Not on file    Active member of club or organization: Not on file    Attends meetings of clubs or organizations: Not on file    Relationship status: Not on file  Other Topics Concern  . Not on file  Social History Narrative   Single. Bachelor's degree. Works as a traveling Therapist, sports.   Wears a bicycle helmet.   Vegetarian diet.   Drinks caffeine.   Smoke alarm in the home. Wears her seatbelt.   Feels safe in her relationships.   Allergies  Allergen Reactions  . Molds & Smuts Shortness Of Breath   Family History  Problem Relation Age  of Onset  . Gout Mother   . Rheum arthritis Mother   . Osteoarthritis Mother   . Breast cancer Mother   . Depression Mother   . Coronary artery disease Mother   . Hyperlipidemia Mother   . Hypertension Mother   . Kidney disease Mother   . Alcohol abuse Father   . Arthritis Father   . Hearing loss Father   . Hyperlipidemia Father   . Hypertension Father   . Psoriasis Father   . Arthritis Brother   . Hyperlipidemia Brother   . Hypertension Brother   . Psoriasis Brother   . Breast cancer Maternal Grandmother   . Heart murmur Maternal Grandmother   . Breast cancer Paternal Grandmother   . Psoriasis Paternal Grandmother   . Dementia Paternal Grandmother   . Heart attack Paternal Grandfather   . Stroke Paternal Grandfather   . Coronary artery disease Paternal Grandfather      Past  medical history, social, surgical and family history all reviewed in electronic medical record.  No pertanent information unless stated regarding to the chief complaint.   Review of Systems:Review of systems updated and as accurate as of 07/02/17  No headache, visual changes, nausea, vomiting, diarrhea, constipation, dizziness, abdominal pain, skin rash, fevers, chills, night sweats, weight loss, swollen lymph nodes, body aches, joint swelling, chest pain, shortness of breath, mood changes.  Positive muscle aches  Objective  There were no vitals taken for this visit. Systems examined below as of 07/02/17   General: No apparent distress alert and oriented x3 mood and affect normal, dressed appropriately.  HEENT: Pupils equal, extraocular movements intact  Respiratory: Patient's speak in full sentences and does not appear short of breath  Cardiovascular: No lower extremity edema, non tender, no erythema  Skin: Warm dry intact with no signs of infection or rash on extremities or on axial skeleton.  Abdomen: Soft nontender  Neuro: Cranial nerves II through XII are intact, neurovascularly intact in all extremities with 2+ DTRs and 2+ pulses.  Lymph: No lymphadenopathy of posterior or anterior cervical chain or axillae bilaterally.  Gait normal with good balance and coordination.  MSK:  Non tender with full range of motion and good stability and symmetric strength and tone of shoulders, elbows,  hip, knee and ankles bilaterally.  Wrist: Right Inspection normal with no visible erythema or swelling. ROM smooth and normal with good flexion and extension and ulnar/radial deviation that is symmetrical with opposite wrist. Palpation is normal over metacarpals, navicular, lunate, and TFCC; tendons without tenderness/ swelling No snuffbox tenderness. No tenderness over Canal of Guyon. Strength 5/5 in all directions without pain. Positive Finkelstein, negative Tinel's and phalens. Negative Watson's  test.  Procedure: Real-time Ultrasound Guided Injection of right Abductor pollicis longs tendon sheath Device: GE Logiq E  Ultrasound guided injection is preferred based studies that show increased duration, increased effect, greater accuracy, decreased procedural pain, increased response rate with ultrasound guided versus blind injection.  Verbal informed consent obtained.  Time-out conducted.  Noted no overlying erythema, induration, or other signs of local infection.  Skin prepped in a sterile fashion.  Local anesthesia: Topical Ethyl chloride.  With sterile technique and under real time ultrasound guidance:  tendon visualized.  23g 5/8 inch needle inserted distal to proximal approach into tendon sheath. Pictures taken  for needle placement. Patient did have injection of 0.5 cc of 0.5% Marcaine, and 0.5 cc of Kenalog 40 mg/dL. Completed without difficulty  Pain immediately resolved suggesting accurate placement of  the medication.  Advised to call if fevers/chills, erythema, induration, drainage, or persistent bleeding.  Images permanently stored and available for review in the ultrasound unit.  Impression: Technically successful ultrasound guided injection.    Impression and Recommendations:     This case required medical decision making of moderate complexity.      Note: This dictation was prepared with Dragon dictation along with smaller phrase technology. Any transcriptional errors that result from this process are unintentional.

## 2017-07-03 ENCOUNTER — Encounter: Payer: Self-pay | Admitting: Family Medicine

## 2017-07-03 ENCOUNTER — Ambulatory Visit: Payer: Self-pay

## 2017-07-03 ENCOUNTER — Ambulatory Visit: Payer: No Typology Code available for payment source | Admitting: Family Medicine

## 2017-07-03 VITALS — BP 100/70 | HR 82 | Ht 65.0 in | Wt 162.0 lb

## 2017-07-03 DIAGNOSIS — M25531 Pain in right wrist: Secondary | ICD-10-CM | POA: Diagnosis not present

## 2017-07-03 DIAGNOSIS — M654 Radial styloid tenosynovitis [de Quervain]: Secondary | ICD-10-CM | POA: Diagnosis not present

## 2017-07-03 HISTORY — DX: Radial styloid tenosynovitis (de quervain): M65.4

## 2017-07-03 NOTE — Patient Instructions (Signed)
Good to see you  Ice is your friend still may need it more the next couple days Will give you a note for 2 days Injected the tendon today  Start my exercises again this weekend if you can and try to do them 2 times a week  See me again in 3 weeks

## 2017-07-03 NOTE — Assessment & Plan Note (Signed)
Patient does have some intrasubstance tearing.  Given injection today to see if that would help some resolution of some of the discomfort and pain.  Hopefully patient will respond well.  We discussed icing regimen and home exercises.  Discussed which activities to do which wants to avoid.  Patient will continue with bracing as well as the nitroglycerin.  Follow-up with me again 4 weeks

## 2017-07-04 ENCOUNTER — Encounter: Payer: Self-pay | Admitting: Obstetrics and Gynecology

## 2017-07-11 MED FILL — SERTRALINE HCL 50 MG TABLET: 50 | 90 days supply | Qty: 90 | Fill #0

## 2017-07-24 ENCOUNTER — Telehealth: Payer: Self-pay | Admitting: Family Medicine

## 2017-07-24 ENCOUNTER — Ambulatory Visit: Payer: No Typology Code available for payment source | Admitting: Family Medicine

## 2017-07-24 NOTE — Telephone Encounter (Signed)
Copied from [place] [id number]. Topic: Quick Communication - Rx Refill/Question >> Jul 24, 2017  2:19 PM Keene Breath wrote: Medication: Diclofenac Sodium (PENNSAID) 2 % SOLN  Pharmacy called and needs PA for a refill.   [address], [address] [place] [address] Phone: [phone] Fax: [phone] Not a 24 hour pharmacy; exact hours not known.

## 2017-07-25 NOTE — Telephone Encounter (Signed)
Insurance is requesting Prior authorization for medication

## 2017-08-06 ENCOUNTER — Ambulatory Visit: Payer: No Typology Code available for payment source | Admitting: Family Medicine

## 2017-08-13 ENCOUNTER — Ambulatory Visit (INDEPENDENT_AMBULATORY_CARE_PROVIDER_SITE_OTHER): Payer: No Typology Code available for payment source | Admitting: Family Medicine

## 2017-08-13 ENCOUNTER — Encounter: Payer: Self-pay | Admitting: Family Medicine

## 2017-08-13 VITALS — BP 110/77 | HR 76 | Temp 97.8°F | Resp 20 | Ht 65.0 in | Wt 159.0 lb

## 2017-08-13 DIAGNOSIS — N3090 Cystitis, unspecified without hematuria: Secondary | ICD-10-CM | POA: Diagnosis not present

## 2017-08-13 DIAGNOSIS — R350 Frequency of micturition: Secondary | ICD-10-CM

## 2017-08-13 LAB — POCT URINALYSIS DIPSTICK
Blood, UA: NEGATIVE
Glucose, UA: NEGATIVE
Leukocytes, UA: NEGATIVE
NITRITE UA: NEGATIVE
PROTEIN UA: NEGATIVE
UROBILINOGEN UA: 0.2 U/dL
pH, UA: 5.5 (ref 5.0–8.0)

## 2017-08-13 MED ORDER — CEPHALEXIN 500 MG PO CAPS: 500.0000 mg | ORAL_CAPSULE | Freq: Three times a day (TID) | ORAL | 0 refills | Status: DC

## 2017-08-13 NOTE — Patient Instructions (Signed)
Start keflex every 8 hours.  Drink plenty of water.  Look at list of most common bladder irritants.   We will call you with urine culture results once available.     Interstitial Cystitis Interstitial cystitis is a condition that causes inflammation of the bladder. The bladder is a hollow organ in the lower part of your abdomen. It stores urine after the urine is made by your kidneys. With interstitial cystitis, you may have pain in the bladder area. You may also have a frequent and urgent need to urinate. The severity of interstitial cystitis can vary from person to person. You may have flare-ups of the condition, and then it may go away for a while. For many people who have this condition, it becomes a long-term problem. What are the causes? The cause of this condition is not known. What increases the risk? This condition is more likely to develop in women. What are the signs or symptoms? Symptoms of interstitial cystitis vary, and they can change over time. Symptoms may include:  Discomfort or pain in the bladder area. This can range from mild to severe. The pain may change in intensity as the bladder fills with urine or as it empties.  Pelvic pain.  An urgent need to urinate.  Frequent urination.  Pain during sexual intercourse.  Pinpoint bleeding on the bladder wall.  For women, the symptoms often get worse during menstruation. How is this diagnosed? This condition is diagnosed by evaluating your symptoms and ruling out other causes. A physical exam will be done. Various tests may be done to rule out other conditions. Common tests include:  Urine tests.  Cystoscopy. In this test, a tool that is like a very thin telescope is used to look into your bladder.  Biopsy. This involves taking a sample of tissue from the bladder wall to be examined under a microscope.  How is this treated? There is no cure for interstitial cystitis, but treatment methods are available to control  your symptoms. Work closely with your health care provider to find the treatments that will be most effective for you. Treatment options may include:  Medicines to relieve pain and to help reduce the number of times that you feel the need to urinate.  Bladder training. This involves learning ways to control when you urinate, such as: ? Urinating at scheduled times. ? Training yourself to delay urination. ? Doing exercises (Kegel exercises) to strengthen the muscles that control urine flow.  Lifestyle changes, such as changing your diet or taking steps to control stress.  Use of a device that provides electrical stimulation in order to reduce pain.  A procedure that stretches your bladder by filling it with air or fluid.  Surgery. This is rare. It is only done for extreme cases if other treatments do not help.  Follow these instructions at home:  Take medicines only as directed by your health care provider.  Use bladder training techniques as directed. ? Keep a bladder diary to find out which foods, liquids, or activities make your symptoms worse. ? Use your bladder diary to schedule bathroom trips. If you are away from home, plan to be near a bathroom at each of your scheduled times. ? Make sure you urinate just before you leave the house and just before you go to bed.  Do Kegel exercises as directed by your health care provider.  Do not drink alcohol.  Do not use any tobacco products, including cigarettes, chewing tobacco, or electronic cigarettes.  If you need help quitting, ask your health care provider.  Make dietary changes as directed by your health care provider. You may need to avoid spicy foods and foods that contain a high amount of potassium.  Limit your drinking of beverages that stimulate urination. These include soda, coffee, and tea.  Keep all follow-up visits as directed by your health care provider. This is important. Contact a health care provider if:  Your  symptoms do not get better after treatment.  Your pain and discomfort are getting worse.  You have more frequent urges to urinate.  You have a fever. Get help right away if:  You are not able to control your bladder at all. This information is not intended to replace advice given to you by your health care provider. Make sure you discuss any questions you have with your health care provider. Document Released: 10/07/2003 Document Revised: 07/14/2015 Document Reviewed: 10/13/2013 Elsevier Interactive Patient Education  [employer].

## 2017-08-13 NOTE — Progress Notes (Signed)
Mary [name] , 01-22-1971, 47 y.o., female MRN: [id number] Patient Care Team    Relationship Specialty Notifications Start End  Ma Hillock, DO PCP - General Family Medicine  03/12/17   Salvadore Dom, MD Consulting Physician Obstetrics and Gynecology  06/20/17     Chief Complaint  Patient presents with  . Urinary Frequency    X 4 DAYS     Subjective: Pt presents for an OV with complaints of  irritable bladder of 4 days duration.  Associated symptoms include suprapubic discomfort when laying on stomach. Fatigue/flu like feeling and urinary frequency, but only able to produce a little bit of urine each time. Overall urinary production is ok. She denies fever, chills, nausea or vomit. She endorses no low back pain.   Pt has tried hydrating more with water. She reports she did go out Thursday evening and drank alcohol, which she rarely does.   Depression screen [facility] 2/9 06/20/2017 03/12/2017 03/12/2017  Decreased Interest 2 1 0  Down, Depressed, Hopeless 2 0 0  PHQ - 2 Score 4 1 0  Altered sleeping 2 0 -  Tired, decreased energy 2 1 -  Change in appetite 2 1 -  Feeling bad or failure about yourself  1 0 -  Trouble concentrating 0 0 -  Moving slowly or fidgety/restless 0 0 -  Suicidal thoughts 0 0 -  PHQ-9 Score 11 3 -  Difficult doing work/chores - Not difficult at all -    Allergies  Allergen Reactions  . Molds & Smuts Shortness Of Breath   Social History   Tobacco Use  . Smoking status: Never Smoker  . Smokeless tobacco: Never Used  Substance Use Topics  . Alcohol use: No    Frequency: Never   Past Medical History:  Diagnosis Date  . Arthritis    Neck, left ankle, clavicle  . Chickenpox 1987  . Chronic constipation   . Costochondritis   . Depression with somatization   . Erythema nodosum   . Fibroid uterus   . Gastritis 2018  . GERD (gastroesophageal reflux disease)   . Hemorrhoid   . History of PCR DNA positive for HSV1    buttocks area  .  Psoriasis   . Rosacea   . Stomach ulcer 2007   Past Surgical History:  Procedure Laterality Date  . COLONOSCOPY WITH ESOPHAGOGASTRODUODENOSCOPY (EGD)  2007, 2008   Patient reports colonoscopy was unsuccessful secondary to her not being able to tolerate prep. Was being completed for chronic constipation.  . ESOPHAGOGASTRODUODENOSCOPY  06/28/2016   gastritis; gastric antrum  . SALPINGECTOMY Right 2004   ectopic pregnancy  . TONSILLECTOMY  2004   Family History  Problem Relation Age of Onset  . Gout Mother   . Rheum arthritis Mother   . Osteoarthritis Mother   . Breast cancer Mother   . Depression Mother   . Coronary artery disease Mother   . Hyperlipidemia Mother   . Hypertension Mother   . Kidney disease Mother   . Alcohol abuse Father   . Arthritis Father   . Hearing loss Father   . Hyperlipidemia Father   . Hypertension Father   . Psoriasis Father   . Arthritis Brother   . Hyperlipidemia Brother   . Hypertension Brother   . Psoriasis Brother   . Breast cancer Maternal Grandmother   . Heart murmur Maternal Grandmother   . Breast cancer Paternal Grandmother   . Psoriasis Paternal Grandmother   . Dementia Paternal  Grandmother   . Heart attack Paternal Grandfather   . Stroke Paternal Grandfather   . Coronary artery disease Paternal Grandfather    Allergies as of 08/13/2017      Reactions   Molds & Smuts Shortness Of Breath      Medication List        Accurate as of 08/13/17 10:18 AM. Always use your most recent med list.          ALREX 0.2 % Susp Generic drug:  loteprednol 1 drop 2 (two) times daily as needed.   buPROPion 300 MG 24 hr tablet Commonly known as:  WELLBUTRIN XL Take 1 tablet (300 mg total) by mouth daily.   Diclofenac Sodium 2 % Soln Commonly known as:  PENNSAID Apply 1 pump twice daily.   nitroGLYCERIN 0.2 mg/hr patch Commonly known as:  NITRODUR - Dosed in mg/24 hr 1/4 patch daily   sertraline 50 MG tablet Commonly known as:   ZOLOFT Take 1 tablet (50 mg total) by mouth daily.   tretinoin 0.05 % cream Commonly known as:  RETIN-A Apply topically at bedtime.   valACYclovir 500 MG tablet Commonly known as:  VALTREX Take 1,000 mg by mouth 2 (two) times daily as needed.   Vitamin D (Ergocalciferol) 50000 units Caps capsule Commonly known as:  DRISDOL Take 1 capsule (50,000 Units total) by mouth every 7 (seven) days.       All past medical history, surgical history, allergies, family history, immunizations andmedications were updated in the EMR today and reviewed under the history and medication portions of their EMR.     ROS: Negative, with the exception of above mentioned in HPI   Objective:  BP 110/77 (BP Location: Left Arm, Patient Position: Sitting, Cuff Size: Normal)   Pulse 76   Temp 97.8 F (36.6 C)   Resp 20   Ht 5\' 5"  (1.651 m)   Wt 159 lb (72.1 kg)   SpO2 98%   BMI 26.46 kg/m  Body mass index is 26.46 kg/m. Gen: Afebrile. No acute distress. Nontoxic in appearance, well developed, well nourished.  HENT: AT. [place]. MMM Eyes:Pupils Equal Round Reactive to light, Extraocular movements intact,  Conjunctiva without redness, discharge or icterus. CV: RRR Abd: Soft. mild suprapubic pressure. ND. BS present.  MSK: no cva tenderness.  Skin: no rashes, purpura or petechiae.  Neuro:  Normal gait. PERLA. EOMi. Alert. Oriented x3  No exam data present No results found. Results for orders placed or performed in visit on 08/13/17 (from the past 24 hour(s))  POCT urinalysis dipstick     Status: Abnormal   Collection Time: 08/13/17 10:11 AM  Result Value Ref Range   Color, UA yellow    Clarity, UA clear    Glucose, UA Negative Negative   Bilirubin, UA 1+    Ketones, UA +    Spec Grav, UA >=1.030 (A) 1.010 - 1.025   Blood, UA negative    pH, UA 5.5 5.0 - 8.0   Protein, UA Negative Negative   Urobilinogen, UA 0.2 0.2 or 1.0 E.U./dL   Nitrite, UA Negative    Leukocytes, UA Negative Negative    Appearance     Odor      Assessment/Plan: [name] is a 47 y.o. female present for OV for  Urinary frequency/cystitis - send for culture, currently urine does not appear infections, but she is having symptoms.  - treat w/ keflex TID, sent for culture.  - Common bladder irritants discussed and handout given.  -  POCT urinalysis dipstick - cephALEXin (KEFLEX) 500 MG capsule; Take 1 capsule (500 mg total) by mouth 3 (three) times daily.  Dispense: 21 capsule; Refill: 0 - Urine Culture - F/U PRN   Reviewed expectations re: course of current medical issues.  Discussed self-management of symptoms.  Outlined signs and symptoms indicating need for more acute intervention.  Patient verbalized understanding and all questions were answered.  Patient received an After-Visit Summary.    Orders Placed This Encounter  Procedures  . POCT urinalysis dipstick    Note is dictated utilizing voice recognition software. Although note has been proof read prior to signing, occasional typographical errors still can be missed. If any questions arise, please do not hesitate to call for verification.   electronically signed by:  Howard Pouch, DO  [place]

## 2017-08-15 LAB — URINE CULTURE
MICRO NUMBER:: 90759092
SPECIMEN QUALITY:: ADEQUATE

## 2017-08-29 ENCOUNTER — Telehealth: Payer: Self-pay | Admitting: Family Medicine

## 2017-08-29 NOTE — Telephone Encounter (Signed)
Copied from [place] [phone]. Topic: Referral - Request >> Aug 29, 2017 11:45 AM Antonieta Iba C wrote: Reason for CRM: pt says that [facility]. Pt says that she would like to have a new referral placed. Pt has siriasis in her bottom, per pt.   Called patient & left detailed message that the referral is noted in her chart which is a requirement for the Focus Plan with [facility]. Patient can now make her appointment at [facility]. No further action is needed from out office for patient's insurance.

## 2017-08-31 ENCOUNTER — Encounter: Payer: Self-pay | Admitting: Family Medicine

## 2017-09-03 ENCOUNTER — Ambulatory Visit: Payer: No Typology Code available for payment source | Admitting: Family Medicine

## 2017-09-09 ENCOUNTER — Encounter: Payer: No Typology Code available for payment source | Admitting: Family Medicine

## 2017-09-19 ENCOUNTER — Ambulatory Visit: Payer: No Typology Code available for payment source | Admitting: Clinical

## 2017-09-19 DIAGNOSIS — F331 Major depressive disorder, recurrent, moderate: Secondary | ICD-10-CM | POA: Diagnosis not present

## 2017-10-07 ENCOUNTER — Ambulatory Visit: Payer: No Typology Code available for payment source | Admitting: Clinical

## 2017-10-07 DIAGNOSIS — F331 Major depressive disorder, recurrent, moderate: Secondary | ICD-10-CM

## 2017-10-08 ENCOUNTER — Encounter: Payer: Self-pay | Admitting: Family Medicine

## 2017-10-08 ENCOUNTER — Ambulatory Visit (INDEPENDENT_AMBULATORY_CARE_PROVIDER_SITE_OTHER): Payer: No Typology Code available for payment source | Admitting: Family Medicine

## 2017-10-08 VITALS — BP 111/75 | HR 81 | Resp 16 | Ht 65.0 in | Wt 163.0 lb

## 2017-10-08 DIAGNOSIS — L409 Psoriasis, unspecified: Secondary | ICD-10-CM | POA: Diagnosis not present

## 2017-10-08 DIAGNOSIS — R21 Rash and other nonspecific skin eruption: Secondary | ICD-10-CM

## 2017-10-08 MED ORDER — AZITHROMYCIN 250 MG PO TABS: ORAL_TABLET | ORAL | 0 refills | Status: DC

## 2017-10-08 NOTE — Progress Notes (Signed)
Mary [name] , 01/21/1971, 47 y.o., female MRN: [id number] Patient Care Team    Relationship Specialty Notifications Start End  Ma Hillock, DO PCP - General Family Medicine  03/12/17   Salvadore Dom, MD Consulting Physician Obstetrics and Gynecology  06/20/17     Chief Complaint  Patient presents with  . Rash    red painful area under left armpit     Subjective: Pt presents for an OV with complaints of rash under left axilla of 5 days duration.  Associated symptoms include painful to touch and redness.  She has a history of psoriasis and currently has a psoriasis rash on her left elbow.  She has clobetasol cream that she uses on her psoriasis but has not tried to use it on her current axillary rash.  SHe has no symptoms on the other axilla.  She denies fever, chills or itchiness.  She has not changed deodorants, personal hygiene products or detergents etc.  Depression screen [facility] 2/9 06/20/2017 03/12/2017 03/12/2017  Decreased Interest 2 1 0  Down, Depressed, Hopeless 2 0 0  PHQ - 2 Score 4 1 0  Altered sleeping 2 0 -  Tired, decreased energy 2 1 -  Change in appetite 2 1 -  Feeling bad or failure about yourself  1 0 -  Trouble concentrating 0 0 -  Moving slowly or fidgety/restless 0 0 -  Suicidal thoughts 0 0 -  PHQ-9 Score 11 3 -  Difficult doing work/chores - Not difficult at all -    Allergies  Allergen Reactions  . Molds & Smuts Shortness Of Breath   Social History   Tobacco Use  . Smoking status: Never Smoker  . Smokeless tobacco: Never Used  Substance Use Topics  . Alcohol use: No    Frequency: Never   Past Medical History:  Diagnosis Date  . Arthritis    Neck, left ankle, clavicle  . Chickenpox 1987  . Chronic constipation   . Costochondritis   . Depression with somatization   . Erythema nodosum   . Fibroid uterus   . Gastritis 2018  . GERD (gastroesophageal reflux disease)   . Hemorrhoid   . History of PCR DNA positive for HSV1    buttocks area  . Psoriasis   . Rosacea   . Stomach ulcer 2007   Past Surgical History:  Procedure Laterality Date  . COLONOSCOPY WITH ESOPHAGOGASTRODUODENOSCOPY (EGD)  2007, 2008   Patient reports colonoscopy was unsuccessful secondary to her not being able to tolerate prep. Was being completed for chronic constipation.  . ESOPHAGOGASTRODUODENOSCOPY  06/28/2016   gastritis; gastric antrum  . SALPINGECTOMY Right 2004   ectopic pregnancy  . TONSILLECTOMY  2004   Family History  Problem Relation Age of Onset  . Gout Mother   . Rheum arthritis Mother   . Osteoarthritis Mother   . Breast cancer Mother   . Depression Mother   . Coronary artery disease Mother   . Hyperlipidemia Mother   . Hypertension Mother   . Kidney disease Mother   . Alcohol abuse Father   . Arthritis Father   . Hearing loss Father   . Hyperlipidemia Father   . Hypertension Father   . Psoriasis Father   . Arthritis Brother   . Hyperlipidemia Brother   . Hypertension Brother   . Psoriasis Brother   . Breast cancer Maternal Grandmother   . Heart murmur Maternal Grandmother   . Breast cancer Paternal Grandmother   .  Psoriasis Paternal Grandmother   . Dementia Paternal Grandmother   . Heart attack Paternal Grandfather   . Stroke Paternal Grandfather   . Coronary artery disease Paternal Grandfather    Allergies as of 10/08/2017      Reactions   Molds & Smuts Shortness Of Breath      Medication List        Accurate as of 10/08/17  2:45 PM. Always use your most recent med list.          ALREX 0.2 % Susp Generic drug:  loteprednol 1 drop 2 (two) times daily as needed.   buPROPion 300 MG 24 hr tablet Commonly known as:  WELLBUTRIN XL Take 1 tablet (300 mg total) by mouth daily.   sertraline 50 MG tablet Commonly known as:  ZOLOFT Take 1 tablet (50 mg total) by mouth daily.   tretinoin 0.05 % cream Commonly known as:  RETIN-A Apply topically at bedtime.   valACYclovir 500 MG tablet Commonly  known as:  VALTREX Take 1,000 mg by mouth 2 (two) times daily as needed.       All past medical history, surgical history, allergies, family history, immunizations andmedications were updated in the EMR today and reviewed under the history and medication portions of their EMR.     ROS: Negative, with the exception of above mentioned in HPI   Objective:  BP 111/75 (BP Location: Right Arm, Patient Position: Sitting, Cuff Size: Normal)   Pulse 81   Resp 16   Ht 5\' 5"  (1.651 m)   Wt 163 lb (73.9 kg)   SpO2 97%   BMI 27.12 kg/m  Body mass index is 27.12 kg/m. Gen: Afebrile. No acute distress. Nontoxic in appearance, well developed, well nourished.  Skin: Red macular papular rash left axilla and similar rash left elbow, no purpura or petechiae.  Neuro: Normal gait. PERLA. EOMi. Alert. Oriented x3  No exam data present No results found. No results found for this or any previous visit (from the past 24 hour(s)).  Assessment/Plan: Mary [name] is a 47 y.o. female present for OV for  Psoriasis -unknown etiology of left axillary rash.  Does not appear infectious today.  Also not characteristic of fungal infection but can not rule out.  Possibly psoriatic in nature given she has a similar flare of her left elbow, however her left elbow rash does appear a little bit more dry and flaky than her axillary rash. -Gust options and treatments with her today.  She has steroid cream at home she is been using on her left elbow and she will first try that temporarily underneath her arm.  If worsens she is to discontinue immediately as this would indicate causes fungal and she would need an antifungal cream.  She can call and I would be happy to prescribe this for her.  Also considered erythrasma and bacterial as potential cause printed prescription for azithromycin to take orally if not improving with creams.  Could also consider clindamycin or erythromycin creams. - azithromycin (ZITHROMAX) 250 MG  tablet; 500 mg day 1, then 250 mg a day  Dispense: 6 tablet; Refill: 0    Reviewed expectations re: course of current medical issues.  Discussed self-management of symptoms.  Outlined signs and symptoms indicating need for more acute intervention.  Patient verbalized understanding and all questions were answered.  Patient received an After-Visit Summary.    No orders of the defined types were placed in this encounter.    Note is dictated utilizing  voice recognition software. Although note has been proof read prior to signing, occasional typographical errors still can be missed. If any questions arise, please do not hesitate to call for verification.   electronically signed by:  Howard Pouch, DO  [place]

## 2017-10-08 NOTE — Patient Instructions (Addendum)
Start the steroid cream twice a day--> if worsening stop and start the azithromycin.  Use cetaphil cream after showers.   Do not use the same razor and try not to shave until resolved.    This maybe from your psoriasis.

## 2017-10-09 ENCOUNTER — Encounter: Payer: Self-pay | Admitting: Family Medicine

## 2017-10-14 ENCOUNTER — Ambulatory Visit: Payer: Self-pay

## 2017-10-14 ENCOUNTER — Other Ambulatory Visit: Payer: Self-pay | Admitting: Occupational Medicine

## 2017-10-14 DIAGNOSIS — M25532 Pain in left wrist: Secondary | ICD-10-CM

## 2017-10-16 ENCOUNTER — Encounter: Payer: Self-pay | Admitting: Obstetrics & Gynecology

## 2017-10-16 ENCOUNTER — Ambulatory Visit (INDEPENDENT_AMBULATORY_CARE_PROVIDER_SITE_OTHER): Payer: No Typology Code available for payment source | Admitting: Obstetrics & Gynecology

## 2017-10-16 VITALS — BP 114/70

## 2017-10-16 DIAGNOSIS — B373 Candidiasis of vulva and vagina: Secondary | ICD-10-CM

## 2017-10-16 DIAGNOSIS — Z01419 Encounter for gynecological examination (general) (routine) without abnormal findings: Secondary | ICD-10-CM | POA: Diagnosis not present

## 2017-10-16 DIAGNOSIS — B3731 Acute candidiasis of vulva and vagina: Secondary | ICD-10-CM

## 2017-10-16 DIAGNOSIS — Z3046 Encounter for surveillance of implantable subdermal contraceptive: Secondary | ICD-10-CM

## 2017-10-16 LAB — WET PREP FOR TRICH, YEAST, CLUE

## 2017-10-16 MED ORDER — FLUCONAZOLE 150 MG PO TABS: 150.0000 mg | ORAL_TABLET | Freq: Every day | ORAL | 2 refills | Status: AC

## 2017-10-16 MED FILL — FLUCONAZOLE 150 MG TABS: 150 | 3 days supply | Qty: 3 | Fill #0

## 2017-10-16 NOTE — Progress Notes (Signed)
Mary [name] 11/10/1970 [id number]   History:    47 y.o. G1P0A1 Single.  Nurse at [employer]  RP:  New patient presenting for annual gyn exam   HPI: Well on Nexplanon x 10/14/2015.  No BTB.  No pelvic pain.  Complains of increased vaginal itching with mild vaginal secretions.  Wonders if it is a yeast vaginitis or associated with her Psoriasis. Currently abstinent.  Urine normal.  Bowel movements normal.  Breasts normal.  Body mass index 27.12 at her October 08, 2017 visit with her family physician.  Health labs with family physician.  Past medical history,surgical history, family history and social history were all reviewed and documented in the EPIC chart.  Gynecologic History No LMP recorded. Patient has had an implant. Contraception: abstinence and Nexplanon Last Pap: 2017. Results were: Normal per patient Last mammogram: 10/2016. Results were: Normal Bone Density: Never Colonoscopy: Never  Obstetric History OB History  Gravida Para Term Preterm AB Living  1 0 0 0 1 0  SAB TAB Ectopic Multiple Live Births      1        # Outcome Date GA Lbr Len/2nd Weight Sex Delivery Anes PTL Lv  1 Ectopic              ROS: A ROS was performed and pertinent positives and negatives are included in the history.  GENERAL: No fevers or chills. HEENT: No change in vision, no earache, sore throat or sinus congestion. NECK: No pain or stiffness. CARDIOVASCULAR: No chest pain or pressure. No palpitations. PULMONARY: No shortness of breath, cough or wheeze. GASTROINTESTINAL: No abdominal pain, nausea, vomiting or diarrhea, melena or bright red blood per rectum. GENITOURINARY: No urinary frequency, urgency, hesitancy or dysuria. MUSCULOSKELETAL: No joint or muscle pain, no back pain, no recent trauma. DERMATOLOGIC: No rash, no itching, no lesions. ENDOCRINE: No polyuria, polydipsia, no heat or cold intolerance. No recent change in weight. HEMATOLOGICAL: No anemia or easy bruising or bleeding. NEUROLOGIC:  No headache, seizures, numbness, tingling or weakness. PSYCHIATRIC: No depression, no loss of interest in normal activity or change in sleep pattern.     Exam:   BP 114/70   There is no height or weight on file to calculate BMI. Recent BMI 10/08/2017 at 27.12  General appearance : Well developed well nourished female. No acute distress HEENT: Eyes: no retinal hemorrhage or exudates,  Neck supple, trachea midline, no carotid bruits, no thyroidmegaly Lungs: Clear to auscultation, no rhonchi or wheezes, or rib retractions  Heart: Regular rate and rhythm, no murmurs or gallops Breast:Examined in sitting and supine position were symmetrical in appearance, no palpable masses or tenderness,  no skin retraction, no nipple inversion, no nipple discharge, no skin discoloration, no axillary or supraclavicular lymphadenopathy Abdomen: no palpable masses or tenderness, no rebound or guarding Extremities: no edema or skin discoloration or tenderness  Pelvic: Vulva: Normal             Vagina: No gross lesions.  Increased vaginal discharge.  Wet prep done.  Cervix: No gross lesions or discharge.  Pap reflex done  Uterus  AV, normal size, shape and consistency, non-tender and mobile  Adnexa  Without masses or tenderness  Anus: Normal  Wet prep:  Yeasts present   Assessment/Plan:  47 y.o. female for annual exam   1. Encounter for routine gynecological examination with Papanicolaou smear of cervix Normal gynecologic exam.  Pap reflex done.  Breast exam normal.  Screening mammogram normal in September 2018.  Health labs with family physician.  Body mass index 27.12.  Recommend physical activity with aerobic activities 5 times a week and weightlifting every 2 days.  2. Encounter for surveillance of implantable subdermal contraceptive Well on Nexplanon since October 14, 2015.  No contraindication to continue.  3. Yeast vaginitis Yeast vaginitis confirmed with wet prep.  Will treat with fluconazole 150  mg/tab. 1 tablet daily for 3 days.  Usage reviewed with patient and prescription sent to pharmacy.  Refills sent.  May use probiotic tablets or suppositories vaginally once a week for prevention. - WET PREP FOR [place], YEAST, CLUE  Other orders - fluconazole (DIFLUCAN) 150 MG tablet; Take 1 tablet (150 mg total) by mouth daily for 3 days.  Counseling on above issues and coordination of care more than 50% for 10 minutes.  Princess Bruins MD, 3:55 PM 10/16/2017

## 2017-10-18 LAB — PAP IG W/ RFLX HPV ASCU

## 2017-10-20 ENCOUNTER — Encounter: Payer: Self-pay | Admitting: Obstetrics & Gynecology

## 2017-10-20 NOTE — Patient Instructions (Signed)
1. Encounter for routine gynecological examination with Papanicolaou smear of cervix Normal gynecologic exam.  Pap reflex done.  Breast exam normal.  Screening mammogram normal in September 2018.  Health labs with family physician.  Body mass index 27.12.  Recommend physical activity with aerobic activities 5 times a week and weightlifting every 2 days.  2. Encounter for surveillance of implantable subdermal contraceptive Well on Nexplanon since October 14, 2015.  No contraindication to continue.  3. Yeast vaginitis Yeast vaginitis confirmed with wet prep.  Will treat with fluconazole 150 mg/tab. 1 tablet daily for 3 days.  Usage reviewed with patient and prescription sent to pharmacy.  Refills sent.  May use probiotic tablets or suppositories vaginally once a week for prevention. - WET PREP FOR [place], YEAST, CLUE  Other orders - fluconazole (DIFLUCAN) 150 MG tablet; Take 1 tablet (150 mg total) by mouth daily for 3 days.  Mary [name], it was a pleasure meeting you today!  I will inform you of your results as soon as they are available.

## 2017-10-28 ENCOUNTER — Ambulatory Visit: Payer: Self-pay | Admitting: Family Medicine

## 2017-10-30 ENCOUNTER — Ambulatory Visit (INDEPENDENT_AMBULATORY_CARE_PROVIDER_SITE_OTHER): Payer: No Typology Code available for payment source | Admitting: Family Medicine

## 2017-10-30 ENCOUNTER — Encounter: Payer: Self-pay | Admitting: Family Medicine

## 2017-10-30 ENCOUNTER — Ambulatory Visit: Payer: No Typology Code available for payment source | Admitting: Family Medicine

## 2017-10-30 VITALS — BP 112/78 | HR 74 | Temp 98.0°F | Resp 20 | Ht 65.0 in | Wt 164.0 lb

## 2017-10-30 DIAGNOSIS — M26623 Arthralgia of bilateral temporomandibular joint: Secondary | ICD-10-CM

## 2017-10-30 MED ORDER — CYCLOBENZAPRINE HCL 5 MG PO TABS: 5.0000 mg | ORAL_TABLET | Freq: Three times a day (TID) | ORAL | 1 refills | Status: DC | PRN

## 2017-10-30 NOTE — Patient Instructions (Signed)
Take aleve every 12 hours with pain when you have a flare.  Use flexeril before bed if needed.    I would recommend a second a opinion on your bite/TMJ. Try [facility].    Temporomandibular Joint Syndrome Temporomandibular joint (TMJ) syndrome is a condition that affects the joints between your jaw and your skull. The TMJs are located near your ears and allow your jaw to open and close. These joints and the nearby muscles are involved in all movements of the jaw. People with TMJ syndrome have pain in the area of these joints and muscles. Chewing, biting, or other movements of the jaw can be difficult or painful. TMJ syndrome can be caused by various things. In many cases, the condition is mild and goes away within a few weeks. For some people, the condition can become a long-term problem. What are the causes? Possible causes of TMJ syndrome include:  Grinding your teeth or clenching your jaw. Some people do this when they are under stress.  Arthritis.  Injury to the jaw.  Head or neck injury.  Teeth or dentures that are not aligned well.  In some cases, the cause of TMJ syndrome may not be known. What are the signs or symptoms? The most common symptom is an aching pain on the side of the head in the area of the TMJ. Other symptoms may include:  Pain when moving your jaw, such as when chewing or biting.  Being unable to open your jaw all the way.  Making a clicking sound when you open your mouth.  Headache.  Earache.  Neck or shoulder pain.  How is this diagnosed? Diagnosis can usually be made based on your symptoms, your medical history, and a physical exam. Your health care provider may check the range of motion of your jaw. Imaging tests, such as X-rays or an MRI, are sometimes done. You may need to see your dentist to determine if your teeth and jaw are lined up correctly. How is this treated? TMJ syndrome often goes away on its own. If treatment is needed,  the options may include:  Eating soft foods and applying ice or heat.  Medicines to relieve pain or inflammation.  Medicines to relax the muscles.  A splint, bite plate, or mouthpiece to prevent teeth grinding or jaw clenching.  Relaxation techniques or counseling to help reduce stress.  Transcutaneous electrical nerve stimulation (TENS). This helps to relieve pain by applying an electrical current through the skin.  Acupuncture. This is sometimes helpful to relieve pain.  Jaw surgery. This is rarely needed.  Follow these instructions at home:  Take medicines only as directed by your health care provider.  Eat a soft diet if you are having trouble chewing.  Apply ice to the painful area. ? Put ice in a plastic bag. ? Place a towel between your skin and the bag. ? Leave the ice on for 20 minutes, 2-3 times a day.  Apply a warm compress to the painful area as directed.  Massage your jaw area and perform any jaw stretching exercises as recommended by your health care provider.  If you were given a mouthpiece or bite plate, wear it as directed.  Avoid foods that require a lot of chewing. Do not chew gum.  Keep all follow-up visits as directed by your health care provider. This is important. Contact a health care provider if:  You are having trouble eating.  You have new or worsening symptoms. Get help right away  if:  Your jaw locks open or closed. This information is not intended to replace advice given to you by your health care provider. Make sure you discuss any questions you have with your health care provider. Document Released: 10/31/2000 Document Revised: 10/06/2015 Document Reviewed: 09/10/2013 Elsevier Interactive Patient Education  [employer].

## 2017-10-30 NOTE — Progress Notes (Signed)
Mary [name] , 1970-10-15, 47 y.o., female MRN: [id number] Patient Care Team    Relationship Specialty Notifications Start End  Ma Hillock, DO PCP - General Family Medicine  03/12/17   Salvadore Dom, MD Consulting Physician Obstetrics and Gynecology  06/20/17     Chief Complaint  Patient presents with  . Ear Pain    left also jaw is sore     Subjective: Pt presents for an OV with complaints of left ear pain of 3 months duration.  Associated symptoms include her right ear hurts some but not like left. She feels it is worsening and now has experienced left jaw pain at the TMJ joint, and below mandible.  She endorses jaw popping.  She is in braces and reports her orthodontist stated she could have her braces off, but she did not feel like her bite was normal yet so she elected to keep them on.  She feels after the last adjustment her jaw has become more tender.  Depression screen [facility] 2/9 06/20/2017 03/12/2017 03/12/2017  Decreased Interest 2 1 0  Down, Depressed, Hopeless 2 0 0  PHQ - 2 Score 4 1 0  Altered sleeping 2 0 -  Tired, decreased energy 2 1 -  Change in appetite 2 1 -  Feeling bad or failure about yourself  1 0 -  Trouble concentrating 0 0 -  Moving slowly or fidgety/restless 0 0 -  Suicidal thoughts 0 0 -  PHQ-9 Score 11 3 -  Difficult doing work/chores - Not difficult at all -    Allergies  Allergen Reactions  . Molds & Smuts Shortness Of Breath   Social History   Tobacco Use  . Smoking status: Never Smoker  . Smokeless tobacco: Never Used  Substance Use Topics  . Alcohol use: No    Frequency: Never   Past Medical History:  Diagnosis Date  . Arthritis    Neck, left ankle, clavicle  . Chickenpox 1987  . Chronic constipation   . Costochondritis   . Depression with somatization   . Erythema nodosum   . Fibroid uterus   . Gastritis 2018  . GERD (gastroesophageal reflux disease)   . Hemorrhoid   . History of PCR DNA positive for HSV1    buttocks area  . Psoriasis   . Rosacea   . Stomach ulcer 2007   Past Surgical History:  Procedure Laterality Date  . COLONOSCOPY WITH ESOPHAGOGASTRODUODENOSCOPY (EGD)  2007, 2008   Patient reports colonoscopy was unsuccessful secondary to her not being able to tolerate prep. Was being completed for chronic constipation.  . ESOPHAGOGASTRODUODENOSCOPY  06/28/2016   gastritis; gastric antrum  . SALPINGECTOMY Right 2004   ectopic pregnancy  . TONSILLECTOMY  2004   Family History  Problem Relation Age of Onset  . Gout Mother   . Rheum arthritis Mother   . Osteoarthritis Mother   . Breast cancer Mother   . Depression Mother   . Coronary artery disease Mother   . Hyperlipidemia Mother   . Hypertension Mother   . Kidney disease Mother   . Alcohol abuse Father   . Arthritis Father   . Hearing loss Father   . Hyperlipidemia Father   . Hypertension Father   . Psoriasis Father   . Arthritis Brother   . Hyperlipidemia Brother   . Hypertension Brother   . Psoriasis Brother   . Breast cancer Maternal Grandmother   . Heart murmur Maternal Grandmother   . Breast  cancer Paternal Grandmother   . Psoriasis Paternal Grandmother   . Dementia Paternal Grandmother   . Heart attack Paternal Grandfather   . Stroke Paternal Grandfather   . Coronary artery disease Paternal Grandfather    Allergies as of 10/30/2017      Reactions   Molds & Smuts Shortness Of Breath      Medication List        Accurate as of 10/30/17 11:59 PM. Always use your most recent med list.          ALREX 0.2 % Susp Generic drug:  loteprednol 1 drop 2 (two) times daily as needed.   buPROPion 300 MG 24 hr tablet Commonly known as:  WELLBUTRIN XL Take 1 tablet (300 mg total) by mouth daily.   clobetasol cream 0.05 % Commonly known as:  TEMOVATE Apply 1 application topically 2 (two) times daily.   cyclobenzaprine 5 MG tablet Commonly known as:  FLEXERIL Take 1 tablet (5 mg total) by mouth 3 (three) times  daily as needed for muscle spasms.   sertraline 50 MG tablet Commonly known as:  ZOLOFT Take 1 tablet (50 mg total) by mouth daily.   tretinoin 0.05 % cream Commonly known as:  RETIN-A Apply topically at bedtime.   valACYclovir 500 MG tablet Commonly known as:  VALTREX Take 1,000 mg by mouth 2 (two) times daily as needed.       All past medical history, surgical history, allergies, family history, immunizations andmedications were updated in the EMR today and reviewed under the history and medication portions of their EMR.     ROS: Negative, with the exception of above mentioned in HPI   Objective:  BP 112/78 (BP Location: Left Arm, Patient Position: Sitting, Cuff Size: Large)   Pulse 74   Temp 98 F (36.7 C)   Resp 20   Ht 5\' 5"  (1.651 m)   Wt 164 lb (74.4 kg)   SpO2 98%   BMI 27.29 kg/m  Body mass index is 27.29 kg/m. Gen: Afebrile. No acute distress. Nontoxic in appearance, well developed, well nourished.  HENT: AT. [place]. Bilateral TM visualized with small amount of fullness in the left TM, no erythema. MMM, no oral lesions. Bilateral nares without erythema or drainage. Throat without erythema or exudates.  No cough, no hoarseness.  No click left TMJ joint.  Mild tenderness over joint and posterior left mandible just inferior to ear.  No erythema present.  No swelling present. Eyes:Pupils Equal Round Reactive to light, Extraocular movements intact,  Conjunctiva without redness, discharge or icterus. Neck/lymp/endocrine: Supple, no lymphadenopathy  No exam data present No results found. No results found for this or any previous visit (from the past 24 hour(s)).  Assessment/Plan: Mary [name] is a 47 y.o. female present for OV for  Bilateral temporomandibular joint pain L>R TMJ discomfort.  Medically we can offer her NSAIDs and muscle relaxer to help with discomfort.   -Advised her to start Aleve every 12 hours with food for 7 days, then use as needed. -Flexeril 5  mg nightly can use up to 3 times daily as needed if able to tolerate without sedation. -Encouraged her to speak with her orthodontist concerning her bite or seek a second opinion through her dentist or another orthodontist, if she feels her bite needs more attention. -Follow-up PRN   Reviewed expectations re: course of current medical issues.  Discussed self-management of symptoms.  Outlined signs and symptoms indicating need for more acute intervention.  Patient verbalized understanding  and all questions were answered.  Patient received an After-Visit Summary.    No orders of the defined types were placed in this encounter.    Note is dictated utilizing voice recognition software. Although note has been proof read prior to signing, occasional typographical errors still can be missed. If any questions arise, please do not hesitate to call for verification.   electronically signed by:  Howard Pouch, DO  [place]

## 2017-10-31 ENCOUNTER — Encounter: Payer: Self-pay | Admitting: Family Medicine

## 2017-10-31 MED FILL — CYCLOBENZAPRINE HCL 5 MG TA: 5 | 10 days supply | Qty: 30 | Fill #0

## 2017-11-01 MED FILL — TRETINOIN 0.05% CREAM: 0.05 | 20 days supply | Qty: 45 | Fill #0

## 2017-11-05 ENCOUNTER — Ambulatory Visit (INDEPENDENT_AMBULATORY_CARE_PROVIDER_SITE_OTHER): Payer: No Typology Code available for payment source | Admitting: Clinical

## 2017-11-05 DIAGNOSIS — F331 Major depressive disorder, recurrent, moderate: Secondary | ICD-10-CM

## 2017-11-19 ENCOUNTER — Ambulatory Visit (INDEPENDENT_AMBULATORY_CARE_PROVIDER_SITE_OTHER): Payer: No Typology Code available for payment source | Admitting: Family Medicine

## 2017-11-19 ENCOUNTER — Encounter: Payer: Self-pay | Admitting: Family Medicine

## 2017-11-19 VITALS — BP 126/72 | HR 79 | Temp 98.1°F | Resp 20 | Ht 65.0 in | Wt 164.0 lb

## 2017-11-19 DIAGNOSIS — K581 Irritable bowel syndrome with constipation: Secondary | ICD-10-CM

## 2017-11-19 MED ORDER — LUBIPROSTONE 8 MCG PO CAPS: 8.0000 ug | ORAL_CAPSULE | Freq: Two times a day (BID) | ORAL | 2 refills | Status: DC

## 2017-11-19 MED FILL — buPROPion HCL ER (XL) 300 M: 300 | 90 days supply | Qty: 90 | Fill #1

## 2017-11-19 MED FILL — SERTRALINE HCL 50 MG TABLET: 50 | 90 days supply | Qty: 90 | Fill #1

## 2017-11-19 NOTE — Progress Notes (Signed)
Mary [name] , 1970-09-29, 47 y.o., female MRN: [id number] Patient Care Team    Relationship Specialty Notifications Start End  Ma Hillock, DO PCP - General Family Medicine  03/12/17   Salvadore Dom, MD Consulting Physician Obstetrics and Gynecology  06/20/17     Chief Complaint  Patient presents with  . Diarrhea    comes and goes then has constipation at times     Subjective: Pt presents for an OV with complaints of GI discomfort of 1.5 years duration. Pt reports her bowels was/wan between diarrhea and constipation.  Associated symptoms include frequent straining causing discomfort with bowel movements, feelings of incomplete bowel emptying, and frequent bowel movements.  She reports her last bowel movement was 4 days ago.  She reports that her stools majority are "rabbit poops "with small hard pebbles.  She has had a negative ANA and celiac labs in the past.  She has tried MiraLAX which is sometimes helpful.  Depression screen [facility] 2/9 06/20/2017 03/12/2017 03/12/2017  Decreased Interest 2 1 0  Down, Depressed, Hopeless 2 0 0  PHQ - 2 Score 4 1 0  Altered sleeping 2 0 -  Tired, decreased energy 2 1 -  Change in appetite 2 1 -  Feeling bad or failure about yourself  1 0 -  Trouble concentrating 0 0 -  Moving slowly or fidgety/restless 0 0 -  Suicidal thoughts 0 0 -  PHQ-9 Score 11 3 -  Difficult doing work/chores - Not difficult at all -    Allergies  Allergen Reactions  . Molds & Smuts Shortness Of Breath   Social History   Tobacco Use  . Smoking status: Never Smoker  . Smokeless tobacco: Never Used  Substance Use Topics  . Alcohol use: No    Frequency: Never   Past Medical History:  Diagnosis Date  . Arthritis    Neck, left ankle, clavicle  . Chickenpox 1987  . Chronic constipation   . Costochondritis   . Depression with somatization   . Erythema nodosum   . Fibroid uterus   . Gastritis 2018  . GERD (gastroesophageal reflux disease)   .  Hemorrhoid   . History of PCR DNA positive for HSV1    buttocks area  . Psoriasis   . Rosacea   . Stomach ulcer 2007   Past Surgical History:  Procedure Laterality Date  . COLONOSCOPY WITH ESOPHAGOGASTRODUODENOSCOPY (EGD)  2007, 2008   Patient reports colonoscopy was unsuccessful secondary to her not being able to tolerate prep. Was being completed for chronic constipation.  . ESOPHAGOGASTRODUODENOSCOPY  06/28/2016   gastritis; gastric antrum  . SALPINGECTOMY Right 2004   ectopic pregnancy  . TONSILLECTOMY  2004   Family History  Problem Relation Age of Onset  . Gout Mother   . Rheum arthritis Mother   . Osteoarthritis Mother   . Breast cancer Mother   . Depression Mother   . Coronary artery disease Mother   . Hyperlipidemia Mother   . Hypertension Mother   . Kidney disease Mother   . Alcohol abuse Father   . Arthritis Father   . Hearing loss Father   . Hyperlipidemia Father   . Hypertension Father   . Psoriasis Father   . Arthritis Brother   . Hyperlipidemia Brother   . Hypertension Brother   . Psoriasis Brother   . Breast cancer Maternal Grandmother   . Heart murmur Maternal Grandmother   . Breast cancer Paternal Grandmother   .  Psoriasis Paternal Grandmother   . Dementia Paternal Grandmother   . Heart attack Paternal Grandfather   . Stroke Paternal Grandfather   . Coronary artery disease Paternal Grandfather    Allergies as of 11/19/2017      Reactions   Molds & Smuts Shortness Of Breath      Medication List        Accurate as of 11/19/17 10:05 AM. Always use your most recent med list.          ALREX 0.2 % Susp Generic drug:  loteprednol 1 drop 2 (two) times daily as needed.   buPROPion 300 MG 24 hr tablet Commonly known as:  WELLBUTRIN XL Take 1 tablet (300 mg total) by mouth daily.   clobetasol cream 0.05 % Commonly known as:  TEMOVATE Apply 1 application topically 2 (two) times daily.   cyclobenzaprine 5 MG tablet Commonly known as:   FLEXERIL Take 1 tablet (5 mg total) by mouth 3 (three) times daily as needed for muscle spasms.   ibuprofen 200 MG tablet Commonly known as:  ADVIL,MOTRIN Take 400 mg by mouth every 8 (eight) hours as needed.   lubiprostone 8 MCG capsule Commonly known as:  AMITIZA Take 1 capsule (8 mcg total) by mouth 2 (two) times daily with a meal.   sertraline 50 MG tablet Commonly known as:  ZOLOFT Take 1 tablet (50 mg total) by mouth daily.   tretinoin 0.05 % cream Commonly known as:  RETIN-A Apply topically at bedtime.   valACYclovir 500 MG tablet Commonly known as:  VALTREX Take 1,000 mg by mouth 2 (two) times daily as needed.       All past medical history, surgical history, allergies, family history, immunizations andmedications were updated in the EMR today and reviewed under the history and medication portions of their EMR.     ROS: Negative, with the exception of above mentioned in HPI   Objective:  BP 126/72 (BP Location: Right Arm, Patient Position: Sitting, Cuff Size: Large)   Pulse 79   Temp 98.1 F (36.7 C)   Resp 20   Ht 5\' 5"  (1.651 m)   Wt 164 lb (74.4 kg)   SpO2 97%   BMI 27.29 kg/m  Body mass index is 27.29 kg/m. Gen: Afebrile. No acute distress. Nontoxic in appearance, well developed, well nourished.  HENT: AT. [place]. MMM, no oral lesions.  Eyes:Pupils Equal Round Reactive to light, Extraocular movements intact,  Conjunctiva without redness, discharge or icterus. Neck/lymp/endocrine: Supple, no lymphadenopathy, no thyromegaly CV: RRR no murmur Chest: CTAB, no wheeze or crackles.  Abd: Soft.  Flat. NTND. BS present.  Stool burden palpated, otherwise no masses palpated. No rebound or guarding.  Skin: No rashes, purpura or petechiae.  Neuro:  Normal gait. PERLA. EOMi. Alert. Oriented x3    No exam data present No results found. No results found for this or any previous visit (from the past 24 hour(s)).  Assessment/Plan: Mary [name] is a 47 y.o. female  present for OV for  Irritable bowel syndrome with constipation -Consistent with irritable bowel syndrome constipation dominant.  -Patient encouraged to drink at least 80 to 100 ounces of water a day.  Routine exercise. -Prior EGD with gastritis only and encourage patient to make sure to call her GI and reschedule her colonoscopy she missed. - FODMAP diet and resources provided to her today and lengthy discussion of diet during visit. -Viewed labs and with negative TSH this year, ANA and celiac panel in the past, do  not feel the need to repeat labs. -Start Amitiza 8 mcg twice daily - start daily probiotics.  - use miralax 1-2 caps daily if needed  - f/u 3 months.    Reviewed expectations re: course of current medical issues.  Discussed self-management of symptoms.  Outlined signs and symptoms indicating need for more acute intervention.  Patient verbalized understanding and all questions were answered.  Patient received an After-Visit Summary.    No orders of the defined types were placed in this encounter.  > 25 minutes spent with patient, >50% of time spent face to face counseling and coordinating care.     Note is dictated utilizing voice recognition software. Although note has been proof read prior to signing, occasional typographical errors still can be missed. If any questions arise, please do not hesitate to call for verification.   electronically signed by:  Howard Pouch, DO  [place]

## 2017-11-19 NOTE — Patient Instructions (Addendum)
Check out this website for FODMAP diet: [employer] Start daily probiotic: align is a good one.  You can use miralax safely, but you will need to taper accordingly to your body.  Drink at least 80-100 ounces of water a day. Start amitiza every 12 hours.  Make sure to follow with your GI on rescheduling colonoscopy.     Constipation, Adult Constipation is when a person:  Poops (has a bowel movement) fewer times in a week than normal.  Has a hard time pooping.  Has poop that is dry, hard, or bigger than normal.  Follow these instructions at home: Eating and drinking   Eat foods that have a lot of fiber, such as: ? Fresh fruits and vegetables. ? Whole grains. ? Beans.  Eat less of foods that are high in fat, low in fiber, or overly processed, such as: ? [place] fries. ? Hamburgers. ? Cookies. ? Candy. ? Soda.  Drink enough fluid to keep your pee (urine) clear or pale yellow. General instructions  Exercise regularly or as told by your doctor.  Go to the restroom when you feel like you need to poop. Do not hold it in.  Take over-the-counter and prescription medicines only as told by your doctor. These include any fiber supplements.  Do pelvic floor retraining exercises, such as: ? Doing deep breathing while relaxing your lower belly (abdomen). ? Relaxing your pelvic floor while pooping.  Watch your condition for any changes.  Keep all follow-up visits as told by your doctor. This is important. Contact a doctor if:  You have pain that gets worse.  You have a fever.  You have not pooped for 4 days.  You throw up (vomit).  You are not hungry.  You lose weight.  You are bleeding from the anus.  You have thin, pencil-like poop (stool). Get help right away if:  You have a fever, and your symptoms suddenly get worse.  You leak poop or have blood in your poop.  Your belly feels hard or  bigger than normal (is bloated).  You have very bad belly pain.  You feel dizzy or you faint. This information is not intended to replace advice given to you by your health care provider. Make sure you discuss any questions you have with your health care provider. Document Released: 07/25/2007 Document Revised: 08/26/2015 Document Reviewed: 07/27/2015 Elsevier Interactive Patient Education  2018 [employer].

## 2017-11-22 ENCOUNTER — Telehealth: Payer: Self-pay | Admitting: Family Medicine

## 2017-11-22 NOTE — Telephone Encounter (Signed)
This has been submitted and awaiting response

## 2017-11-22 NOTE — Telephone Encounter (Signed)
Copied from [place] [id number]. Topic: Quick Communication - See Telephone Encounter >> Nov 22, 2017  2:41 PM Rutherford Nail, NT wrote: CRM for notification. See Telephone encounter for: 11/22/17. Patient calling and states that a prior authorization is needed for lubiprostone (AMITIZA) 8 MCG capsule. Please advise.

## 2017-11-25 ENCOUNTER — Encounter: Payer: Self-pay | Admitting: Family Medicine

## 2017-11-27 ENCOUNTER — Ambulatory Visit: Payer: No Typology Code available for payment source | Admitting: Clinical

## 2017-11-27 DIAGNOSIS — F331 Major depressive disorder, recurrent, moderate: Secondary | ICD-10-CM | POA: Diagnosis not present

## 2017-11-29 NOTE — Telephone Encounter (Signed)
Patient called to get the status of the PA for her medication lubiprostone (AMITIZA) 8 MCG capsule.  Patient also wanted to know if there was an alternative medication she could take that the insurance company would approve.  Patient needs answers as soon as possible.  CB# [id number].

## 2017-11-29 NOTE — Telephone Encounter (Signed)
Lizette from [employer] calling [phone] calling to get additional information on the  lubiprostone (AMITIZA) 8 MCG capsule  To complete the authorization

## 2017-12-02 NOTE — Telephone Encounter (Signed)
PA submitted 11/29/17. Waiting on decision.

## 2017-12-02 NOTE — Telephone Encounter (Signed)
Tried to call number back and wrong number.

## 2017-12-03 ENCOUNTER — Other Ambulatory Visit: Payer: Self-pay | Admitting: Obstetrics & Gynecology

## 2017-12-03 DIAGNOSIS — Z1231 Encounter for screening mammogram for malignant neoplasm of breast: Secondary | ICD-10-CM

## 2017-12-04 ENCOUNTER — Telehealth: Payer: Self-pay

## 2017-12-04 MED ORDER — LINACLOTIDE 145 MCG PO CAPS: 145.0000 ug | ORAL_CAPSULE | Freq: Every day | ORAL | 5 refills | Status: DC

## 2017-12-04 MED FILL — LINZESS 145 MCG CAPSULE: 145 | 90 days supply | Qty: 90 | Fill #0

## 2017-12-04 NOTE — Telephone Encounter (Signed)
Fax received from insurance company stating Amitiza was denied. Alternative : Linzess and Movantik. Patient has tried Miralax.

## 2017-12-04 NOTE — Telephone Encounter (Signed)
Detailed message left on voice mail, okay per DPR.  

## 2017-12-04 NOTE — Telephone Encounter (Signed)
amitiza PA declined informaion sent to Dr Raoul Pitch.

## 2017-12-04 NOTE — Telephone Encounter (Signed)
linzess prescribed. Please make pt aware of medication change secondary to insurance.

## 2017-12-07 ENCOUNTER — Other Ambulatory Visit: Payer: Self-pay

## 2017-12-07 ENCOUNTER — Ambulatory Visit (HOSPITAL_COMMUNITY)
Admission: EM | Admit: 2017-12-07 | Discharge: 2017-12-07 | Disposition: A | Discharge status: Home / Self Care | Payer: No Typology Code available for payment source | Attending: Family Medicine | Admitting: Family Medicine

## 2017-12-07 ENCOUNTER — Encounter (HOSPITAL_COMMUNITY): Payer: Self-pay | Admitting: Emergency Medicine

## 2017-12-07 DIAGNOSIS — M25531 Pain in right wrist: Secondary | ICD-10-CM | POA: Diagnosis not present

## 2017-12-07 MED ORDER — PREDNISONE 20 MG PO TABS: 40.0000 mg | ORAL_TABLET | Freq: Every day | ORAL | 0 refills | Status: AC

## 2017-12-07 NOTE — Discharge Instructions (Signed)
May use splint as needed for comfort.  Ice at end of day.  Use of previously prescribed nitro patches as prescribed.  5 days of prednisone.  Please continue to follow up with orthopedics for recheck of your symptoms as has been ongoing for long period of time.

## 2017-12-07 NOTE — ED Provider Notes (Signed)
[facility]    CSN: [id number] Arrival date & time: 12/07/17  [place]     History   Chief Complaint Chief Complaint  Patient presents with  . Wrist Pain    HPI Mary [name] is a 47 y.o. female.   Sameria presents with complaints of right wrist pain. This has been ongoing issue for her, starting in January of this year. Discovered a few months later that she had a fracture and torn tendon to the wrist. She is right handed. Has followed with sports medicine, hasn't been recently. In may received a joint injection to right thumb which did help. States pain is worsening and increasing in multiple other locations. No known injury. She works as a Marine scientist. States even movements such as opening a door or lifting a bowl causes pain to the wrist and forearm. Radiates up her arm. No elbow pain. No numbness or tingling. Occasionally takes advil otherwise hasn't been taking any medications. Uses a brace sometimes while working. Was prescribed nitro patches but has not been using. States had some bilateral wrist pain approximately 1 year ago and was seen by rheumatology with a negative work up. At times she feels her wrist and forearm swell, and at times turns red.    ROS per HPI.      Past Medical History:  Diagnosis Date  . Arthritis    Neck, left ankle, clavicle  . Chickenpox 1987  . Chronic constipation   . Costochondritis   . Depression with somatization   . Erythema nodosum   . Fibroid uterus   . Gastritis 2018  . GERD (gastroesophageal reflux disease)   . Hemorrhoid   . History of PCR DNA positive for HSV1    buttocks area  . Psoriasis   . Rosacea   . Stomach ulcer 2007    Patient Active Problem List   Diagnosis Date Noted  . Irritable bowel syndrome with constipation 11/19/2017  . De Quervain's tenosynovitis, right 07/03/2017  . Overweight (BMI 25.0-29.9) 06/20/2017  . Multiple pigmented nevi 06/20/2017  . Distal radius fracture, right 05/16/2017  .  Tenosynovitis 05/01/2017  . Nexplanon in place 03/14/2017  . Depression with somatization   . Rosacea 03/12/2017  . Vegetarian diet 03/12/2017  . Chronic constipation 03/12/2017  . Psoriasis 03/12/2017  . Depression     Past Surgical History:  Procedure Laterality Date  . COLONOSCOPY WITH ESOPHAGOGASTRODUODENOSCOPY (EGD)  2007, 2008   Patient reports colonoscopy was unsuccessful secondary to her not being able to tolerate prep. Was being completed for chronic constipation.  . ESOPHAGOGASTRODUODENOSCOPY  06/28/2016   gastritis; gastric antrum  . SALPINGECTOMY Right 2004   ectopic pregnancy  . TONSILLECTOMY  2004    OB History    Gravida  1   Para  0   Term  0   Preterm  0   AB  1   Living  0     SAB      TAB      Ectopic  1   Multiple      Live Births               Home Medications    Prior to Admission medications   Medication Sig Start Date End Date Taking? Authorizing Provider  buPROPion (WELLBUTRIN XL) 300 MG 24 hr tablet Take 1 tablet (300 mg total) by mouth daily. 06/20/17   Kuneff, Renee A, DO  clobetasol cream (TEMOVATE) 8.10 % Apply 1 application topically 2 (two) times daily.  [provider]  cyclobenzaprine (FLEXERIL) 5 MG tablet Take 1 tablet (5 mg total) by mouth 3 (three) times daily as needed for muscle spasms. 10/30/17   Kuneff, Renee A, DO  ibuprofen (ADVIL,MOTRIN) 200 MG tablet Take 400 mg by mouth every 8 (eight) hours as needed.    [provider]  linaclotide Rolan Lipa) 145 MCG CAPS capsule Take 1 capsule (145 mcg total) by mouth daily before breakfast. 12/04/17   Kuneff, Renee A, DO  loteprednol (ALREX) 0.2 % SUSP 1 drop 2 (two) times daily as needed.    [provider]  predniSONE (DELTASONE) 20 MG tablet Take 2 tablets (40 mg total) by mouth daily with breakfast for 5 days. 12/07/17 12/12/17  Zigmund Gottron, NP  sertraline (ZOLOFT) 50 MG tablet Take 1 tablet (50 mg total) by mouth daily. 06/20/17   Kuneff,  Renee A, DO  tretinoin (RETIN-A) 0.05 % cream Apply topically at bedtime.    [provider]  valACYclovir (VALTREX) 500 MG tablet Take 1,000 mg by mouth 2 (two) times daily as needed.    [provider]    Family History Family History  Problem Relation Age of Onset  . Gout Mother   . Rheum arthritis Mother   . Osteoarthritis Mother   . Breast cancer Mother   . Depression Mother   . Coronary artery disease Mother   . Hyperlipidemia Mother   . Hypertension Mother   . Kidney disease Mother   . Alcohol abuse Father   . Arthritis Father   . Hearing loss Father   . Hyperlipidemia Father   . Hypertension Father   . Psoriasis Father   . Arthritis Brother   . Hyperlipidemia Brother   . Hypertension Brother   . Psoriasis Brother   . Breast cancer Maternal Grandmother   . Heart murmur Maternal Grandmother   . Breast cancer Paternal Grandmother   . Psoriasis Paternal Grandmother   . Dementia Paternal Grandmother   . Heart attack Paternal Grandfather   . Stroke Paternal Grandfather   . Coronary artery disease Paternal Grandfather     Social History Social History   Tobacco Use  . Smoking status: Never Smoker  . Smokeless tobacco: Never Used  Substance Use Topics  . Alcohol use: No    Frequency: Never  . Drug use: Yes     Allergies   Molds & smuts   Review of Systems Review of Systems   Physical Exam Triage Vital Signs ED Triage Vitals  Enc Vitals Group     BP 12/07/17 1732 121/71     Pulse Rate 12/07/17 1732 69     Resp 12/07/17 1732 18     Temp 12/07/17 1732 98.4 F (36.9 C)     Temp Source 12/07/17 1732 Oral     SpO2 12/07/17 1732 100 %     Weight --      Height --      Head Circumference --      Peak Flow --      Pain Score 12/07/17 1731 6     Pain Loc --      Pain Edu? --      Excl. in [place]? --    No data found.  Updated Vital Signs BP 121/71 (BP Location: Left Arm)   Pulse 69   Temp 98.4 F (36.9 C) (Oral)   Resp 18    SpO2 100%   Visual Acuity Right Eye Distance:   Left Eye Distance:  Bilateral Distance:    Right Eye Near:   Left Eye Near:    Bilateral Near:     Physical Exam  Constitutional: She is oriented to person, place, and time. She appears well-developed and well-nourished. No distress.  Cardiovascular: Normal rate, regular rhythm and normal heart sounds.  Pulmonary/Chest: Effort normal and breath sounds normal.  Musculoskeletal:       Right wrist: She exhibits normal range of motion, no tenderness, no bony tenderness, no swelling, no effusion, no crepitus, no deformity and no laceration.       Right hand: Normal.  Wrist is non tender on palpation; indications pain to dorsal aspect of distal forearm with mild swelling noted; no redness or bruising; strong radial pulse; full ROM of wrist, slight pain with flexion and extension; no pain with rotation; no elbow pain; no hand pain; negative snuff box tenderness; no thumb pain; indicates pain to lateral distal ulna, no pain on palpation   Neurological: She is alert and oriented to person, place, and time.  Skin: Skin is warm and dry.     UC Treatments / Results  Labs (all labs ordered are listed, but only abnormal results are displayed) Labs Reviewed - No data to display  EKG None  Radiology No results found.  Procedures Procedures (including critical care time)  Medications Ordered in UC Medications - No data to display  Initial Impression / Assessment and Plan / UC Course  I have reviewed the triage vital signs and the nursing notes.  Pertinent labs & imaging results that were available during my care of the patient were reviewed by me and considered in my medical decision making (see chart for details).     No new injury to wrist, non tender on palpation, imaging deferred. Acute on chronic right wrist pain. Intermittent swelling and redness. Has not completed nitro patches as prescribed. Recommend further evaluation and  treatment with ortho and/or sports medicine. 5 days of prednisone provided for acute flair. Additional brace provided for continued prn use- states will leave one at work and keep one at home. Patient verbalized understanding and agreeable to plan.   Final Clinical Impressions(s) / UC Diagnoses   Final diagnoses:  Right wrist pain     Discharge Instructions     May use splint as needed for comfort.  Ice at end of day.  Use of previously prescribed nitro patches as prescribed.  5 days of prednisone.  Please continue to follow up with orthopedics for recheck of your symptoms as has been ongoing for long period of time.    ED Prescriptions    Medication Sig Dispense Auth. Provider   predniSONE (DELTASONE) 20 MG tablet Take 2 tablets (40 mg total) by mouth daily with breakfast for 5 days. 10 tablet Zigmund Gottron, NP     Controlled Substance Prescriptions [place] Controlled Substance Registry consulted? Not Applicable   Zigmund Gottron, NP 12/07/17 1836

## 2017-12-07 NOTE — ED Triage Notes (Signed)
The patient presented to the [facility] with a complaint of right wrist pain. The patient reported that she had a previous fx and torn tendons in January. The patient reported being followed by Ortho.

## 2017-12-11 ENCOUNTER — Ambulatory Visit: Payer: No Typology Code available for payment source | Admitting: Clinical

## 2017-12-13 ENCOUNTER — Telehealth: Payer: Self-pay | Admitting: *Deleted

## 2017-12-13 DIAGNOSIS — M25539 Pain in unspecified wrist: Secondary | ICD-10-CM

## 2017-12-13 NOTE — Telephone Encounter (Signed)
Please call pt: I see she was seen in the ED for right wrist pain. She will need to follow for that ED visit, for this condition here if she does not have an orthopedic or sports med in which she is already established and can be seen.

## 2017-12-13 NOTE — Telephone Encounter (Signed)
Patient requesting referral to Ortho for wrist pain please advise

## 2017-12-13 NOTE — Telephone Encounter (Signed)
Left message patient needs an appointment with Dr Raoul Pitch for her to do a referral or patient can schedule an appt with Ortho or sports med if she is already established with them.

## 2017-12-13 NOTE — Telephone Encounter (Signed)
Copied from [place] [id number]. Topic: Referral - Request for Referral >> Dec 13, 2017  1:24 PM Alfredia Ferguson R wrote: Has patient seen PCP for this complaint? Yes, Dr Tamala Julian Referral for which specialty: Orthopedic Preferred provider/office: [facility]  Reason for referral: Right Wrist Pain

## 2017-12-16 ENCOUNTER — Ambulatory Visit (INDEPENDENT_AMBULATORY_CARE_PROVIDER_SITE_OTHER): Payer: No Typology Code available for payment source | Admitting: Family Medicine

## 2017-12-16 ENCOUNTER — Encounter: Payer: Self-pay | Admitting: Family Medicine

## 2017-12-16 VITALS — BP 133/80 | HR 77 | Resp 16 | Ht 65.0 in | Wt 162.0 lb

## 2017-12-16 DIAGNOSIS — K5909 Other constipation: Secondary | ICD-10-CM | POA: Diagnosis not present

## 2017-12-16 DIAGNOSIS — M25531 Pain in right wrist: Secondary | ICD-10-CM

## 2017-12-16 MED ORDER — LINACLOTIDE 145 MCG PO CAPS: 145.0000 ug | ORAL_CAPSULE | Freq: Every day | ORAL | 11 refills | Status: DC

## 2017-12-16 NOTE — Patient Instructions (Addendum)
I have referred you to [facility] for your wrist.  I hope you get some answers.

## 2017-12-16 NOTE — Progress Notes (Signed)
Mary [name] , 14-Oct-1970, 47 y.o., female MRN: [id number] Patient Care Team    Relationship Specialty Notifications Start End  Ma Hillock, DO PCP - General Family Medicine  03/12/17   Salvadore Dom, MD Consulting Physician Obstetrics and Gynecology  06/20/17     Chief Complaint  Patient presents with  . Wrist Pain    right wrist pain     Subjective: Pt presents for an OV with complaints of right wrist pain  Since 03/2017. She has been been following with Sports med and has received injections and nitro patches. Initially pain was radial aspect, now pain is ulnar aspect and radiates to hand.   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 She has been wearing her arm brace as instructed. She reports even with brushing her hair or teeth she has pain, anything grasping is painful. She has noticed weakness with any weight-such as picking up a dish. She would like referral to orthopedics  Today and actually already made her appt for this week.  She wa seen in the ED last week for issue as well.   Chronic constipation: she reports the linzess is actually working well for her and her BM have regulated and bloating decreased. She is pleased.   Depression screen [facility] 2/9 06/20/2017 03/12/2017 03/12/2017  Decreased Interest 2 1 0  Down, Depressed, Hopeless 2 0 0  PHQ - 2 Score 4 1 0  Altered sleeping 2 0 -  Tired, decreased energy 2 1 -  Change in appetite 2 1 -  Feeling bad or failure about yourself  1 0 -  Trouble concentrating 0 0 -  Moving slowly or fidgety/restless 0 0 -  Suicidal thoughts 0 0 -  PHQ-9 Score 11 3 -  Difficult doing work/chores - Not difficult at all -    Allergies  Allergen Reactions  . Molds & Smuts Shortness Of Breath   Social History   Tobacco Use  . Smoking status: Never Smoker  . Smokeless tobacco: Never Used  Substance Use Topics  . Alcohol use: No    Frequency: Never   Past Medical History:    Diagnosis Date  . Arthritis    Neck, left ankle, clavicle  . Chickenpox 1987  . Chronic constipation   . Costochondritis   . Depression with somatization   . Erythema nodosum   . Fibroid uterus   . Gastritis 2018  . GERD (gastroesophageal reflux disease)   . Hemorrhoid   . History of PCR DNA positive for HSV1    buttocks area  . Psoriasis   . Rosacea   . Stomach ulcer 2007   Past Surgical History:  Procedure Laterality Date  . COLONOSCOPY WITH ESOPHAGOGASTRODUODENOSCOPY (EGD)  2007, 2008   Patient reports colonoscopy was unsuccessful secondary to her not being able to tolerate prep. Was being completed for chronic constipation.  . ESOPHAGOGASTRODUODENOSCOPY  06/28/2016   gastritis; gastric antrum  . SALPINGECTOMY Right 2004   ectopic pregnancy  . TONSILLECTOMY  2004   Family History  Problem Relation Age of Onset  . Gout Mother   . Rheum arthritis Mother   . Osteoarthritis Mother   . Breast cancer Mother   . Depression Mother   . Coronary artery disease Mother   . Hyperlipidemia Mother   . Hypertension Mother   . Kidney disease Mother   . Alcohol abuse Father   . Arthritis Father   . Hearing  loss Father   . Hyperlipidemia Father   . Hypertension Father   . Psoriasis Father   . Arthritis Brother   . Hyperlipidemia Brother   . Hypertension Brother   . Psoriasis Brother   . Breast cancer Maternal Grandmother   . Heart murmur Maternal Grandmother   . Breast cancer Paternal Grandmother   . Psoriasis Paternal Grandmother   . Dementia Paternal Grandmother   . Heart attack Paternal Grandfather   . Stroke Paternal Grandfather   . Coronary artery disease Paternal Grandfather    Allergies as of 12/16/2017      Reactions   Molds & Smuts Shortness Of Breath      Medication List        Accurate as of 12/16/17  1:00 PM. Always use your most recent med list.          ALREX 0.2 % Susp Generic drug:  loteprednol 1 drop 2 (two) times daily as needed.    buPROPion 300 MG 24 hr tablet Commonly known as:  WELLBUTRIN XL Take 1 tablet (300 mg total) by mouth daily.   clobetasol cream 0.05 % Commonly known as:  TEMOVATE Apply 1 application topically 2 (two) times daily.   cyclobenzaprine 5 MG tablet Commonly known as:  FLEXERIL Take 1 tablet (5 mg total) by mouth 3 (three) times daily as needed for muscle spasms.   ibuprofen 200 MG tablet Commonly known as:  ADVIL,MOTRIN Take 400 mg by mouth every 8 (eight) hours as needed.   linaclotide 145 MCG Caps capsule Commonly known as:  LINZESS Take 1 capsule (145 mcg total) by mouth daily before breakfast.   sertraline 50 MG tablet Commonly known as:  ZOLOFT Take 1 tablet (50 mg total) by mouth daily.   tretinoin 0.05 % cream Commonly known as:  RETIN-A Apply topically at bedtime.   valACYclovir 500 MG tablet Commonly known as:  VALTREX Take 1,000 mg by mouth 2 (two) times daily as needed.       All past medical history, surgical history, allergies, family history, immunizations andmedications were updated in the EMR today and reviewed under the history and medication portions of their EMR.     ROS: Negative, with the exception of above mentioned in HPI   Objective:  BP 133/80 (BP Location: Right Arm, Patient Position: Sitting, Cuff Size: Normal)   Pulse 77   Resp 16   Ht 5\' 5"  (1.651 m)   Wt 162 lb (73.5 kg)   SpO2 97%   BMI 26.96 kg/m  Body mass index is 26.96 kg/m. Gen: Afebrile. No acute distress. Nontoxic in appearance, well developed, well nourished.  HENT: AT. .  MMM, no oral lesions.  Eyes:Pupils Equal Round Reactive to light, Extraocular movements intact,  Conjunctiva without redness, discharge or icterus. MSK: Wrist splint in place. Points to ulnar aspect of arm and wrist as location of pain. Neg tinel at elbow.  Neuro: Normal gait. PERLA. EOMi. Alert. Oriented x3  Psych: Normal affect, dress and demeanor. Normal speech. Normal thought content and  judgment  No exam data present No results found. No results found for this or any previous visit (from the past 24 hour(s)).  Assessment/Plan: Toiya Morrish is a 47 y.o. female present for OV for  Right wrist pain - chronic wrist pain, not responding to injections, oral meds and bracing by SM.  - Ambulatory referral to Orthopedic Surgery  Chronic constipation Linzess is Working well. Refills for 12 months provided today. Can refill with CPE  Reviewed expectations re: course of current medical issues.  Discussed self-management of symptoms.  Outlined signs and symptoms indicating need for more acute intervention.  Patient verbalized understanding and all questions were answered.  Patient received an After-Visit Summary.    Orders Placed This Encounter  Procedures  . Ambulatory referral to Orthopedic Surgery     Note is dictated utilizing voice recognition software. Although note has been proof read prior to signing, occasional typographical errors still can be missed. If any questions arise, please do not hesitate to call for verification.   electronically signed by:  Howard Pouch, DO  [place]

## 2017-12-19 NOTE — Telephone Encounter (Signed)
Although I feel it is absolutely not needed for a PCP to place referrals, when they have already been referred by their specialist (which we referred them to).... We will do so. Please place referral for [facility] benefit only, let Diane know she does not actually have to do a referral process and schedule it.  DDX wrist pain

## 2017-12-19 NOTE — Telephone Encounter (Signed)
Referral placed.

## 2017-12-19 NOTE — Addendum Note (Signed)
Addended by: Leota Jacobsen on: 12/19/2017 02:16 PM   Modules accepted: Orders

## 2017-12-19 NOTE — Telephone Encounter (Addendum)
Patient went to [facility] and they referred her to Dr Burney Gauze , hand specialist @ [address] [place]. She needs a referral placed for her to go there, her appt is Tuesday @ 9am. Please advise.

## 2017-12-20 NOTE — Addendum Note (Signed)
Addended by: Howard Pouch A on: 12/20/2017 07:47 AM   Modules accepted: Orders

## 2017-12-24 NOTE — Telephone Encounter (Signed)
Patient calling and states that she went to Dr Bertis Ruddy office for an appointment and they were very rude to her because the referral was not placed. States that she is needing this referral placed to get in with Dr Burney Gauze. Would like this done asap. States that she will call every day, if she has to, to get this done.

## 2017-12-25 ENCOUNTER — Ambulatory Visit: Payer: No Typology Code available for payment source | Admitting: Clinical

## 2017-12-27 NOTE — Telephone Encounter (Signed)
Patient called in. Dr. Bertis Ruddy office is still saying they have not received the referral. Manually faxed the referral to their office [phone] today.  Patient has put in the [facility] referral.

## 2018-01-08 ENCOUNTER — Ambulatory Visit
Admission: RE | Admit: 2018-01-08 | Discharge: 2018-01-08 | Disposition: A | Discharge status: Home / Self Care | Payer: No Typology Code available for payment source | Source: Ambulatory Visit | Attending: Obstetrics & Gynecology | Admitting: Obstetrics & Gynecology

## 2018-01-08 DIAGNOSIS — Z1231 Encounter for screening mammogram for malignant neoplasm of breast: Secondary | ICD-10-CM

## 2018-01-23 ENCOUNTER — Ambulatory Visit (INDEPENDENT_AMBULATORY_CARE_PROVIDER_SITE_OTHER): Payer: No Typology Code available for payment source | Admitting: Clinical

## 2018-01-23 DIAGNOSIS — F331 Major depressive disorder, recurrent, moderate: Secondary | ICD-10-CM

## 2018-01-28 ENCOUNTER — Other Ambulatory Visit (HOSPITAL_COMMUNITY): Payer: Self-pay | Admitting: Orthopedic Surgery

## 2018-01-28 DIAGNOSIS — M25531 Pain in right wrist: Secondary | ICD-10-CM

## 2018-01-31 ENCOUNTER — Ambulatory Visit: Payer: No Typology Code available for payment source | Admitting: Family Medicine

## 2018-01-31 ENCOUNTER — Ambulatory Visit (INDEPENDENT_AMBULATORY_CARE_PROVIDER_SITE_OTHER): Payer: No Typology Code available for payment source | Admitting: Family Medicine

## 2018-01-31 ENCOUNTER — Encounter: Payer: Self-pay | Admitting: Family Medicine

## 2018-01-31 VITALS — BP 116/70 | HR 77 | Temp 98.1°F | Resp 16 | Ht 65.0 in | Wt 163.0 lb

## 2018-01-31 DIAGNOSIS — M25531 Pain in right wrist: Secondary | ICD-10-CM

## 2018-01-31 DIAGNOSIS — M654 Radial styloid tenosynovitis [de Quervain]: Secondary | ICD-10-CM | POA: Diagnosis not present

## 2018-01-31 MED ORDER — DICLOFENAC SODIUM 75 MG PO TBEC: 75.0000 mg | DELAYED_RELEASE_TABLET | Freq: Two times a day (BID) | ORAL | 5 refills | Status: DC

## 2018-01-31 MED FILL — DICLOFENAC SODIUM 75 MG TAB: 75 | 30 days supply | Qty: 60 | Fill #0

## 2018-01-31 NOTE — Patient Instructions (Signed)
Start diclofenac every 12 hours- with some food. This can help take down some of the inflammatory reaction.  Call your specialist and let them know you are having more symptoms and see if MRI can be scheduled sooner.   Please help [place] help you:  We are honored you have chosen [facility] for your Primary Care home. Below you will find basic instructions that you may need to access in the future. Please help [place] help you by reading the instructions, which cover many of the frequent questions we experience.   Prescription refills and request:  -In order to allow more efficient response time, please call your pharmacy for all refills. They will forward the request electronically to [place]. This allows for the quickest possible response. Request left on a nurse line can take longer to refill, since these are checked as time allows between office patients and other phone calls.  - refill request can take up to 3-5 working days to complete.  - If request is sent electronically and request is appropiate, it is usually completed in 1-2 business days.  - all patients will need to be seen routinely for all chronic medical conditions requiring prescription medications (see follow-up below). If you are overdue for follow up on your condition, you will be asked to make an appointment and we will call in enough medication to cover you until your appointment (up to 30 days).  - all controlled substances will require a face to face visit to request/refill.  - if you desire your prescriptions to go through a new pharmacy, and have an active script at original pharmacy, you will need to call your pharmacy and have scripts transferred to new pharmacy. This is completed between the pharmacy locations and not by your provider.    Results: If any images or labs were ordered, it can take up to 1 week to get results depending on the test ordered and the lab/facility running and resulting the test. - Normal or stable  results, which do not need further discussion, may be released to your mychart immediately with attached note to you. A call may not be generated for normal results. Please make certain to sign up for mychart. If you have questions on how to activate your mychart you can call the front office.  - If your results need further discussion, our office will attempt to contact you via phone, and if unable to reach you after 2 attempts, we will release your abnormal result to your mychart with instructions.  - All results will be automatically released in mychart after 1 week.  - Your provider will provide you with explanation and instruction on all relevant material in your results. Please keep in mind, results and labs may appear confusing or abnormal to the untrained eye, but it does not mean they are actually abnormal for you personally. If you have any questions about your results that are not covered, or you desire more detailed explanation than what was provided, you should make an appointment with your provider to do so.   Our office handles many outgoing and incoming calls daily. If we have not contacted you within 1 week about your results, please check your mychart to see if there is a message first and if not, then contact our office.  In helping with this matter, you help decrease call volume, and therefore allow [place] to be able to respond to patients needs more efficiently.   Acute office visits (sick visit):  An acute visit is intended for a new problem and are scheduled in shorter time slots to allow schedule openings for patients with new problems. This is the appropriate visit to discuss a new problem. Problems will not be addressed by phone call or Echart message. Appointment is needed if requesting treatment. In order to provide you with excellent quality medical care with proper time for you to explain your problem, have an exam and receive treatment with instructions, these appointments should  be limited to one new problem per visit. If you experience a new problem, in which you desire to be addressed, please make an acute office visit, we save openings on the schedule to accommodate you. Please do not save your new problem for any other type of visit, let us take care of it properly and quickly for you.   Follow up visits:  Depending on your condition(s) your provider will need to see you routinely in order to provide you with quality care and prescribe medication(s). Most chronic conditions (Example: hypertension, Diabetes, depression/anxiety... etc), require visits a couple times a year. Your provider will instruct you on proper follow up for your personal medical conditions and history. Please make certain to make follow up appointments for your condition as instructed. Failing to do so could result in lapse in your medication treatment/refills. If you request a refill, and are overdue to be seen on a condition, we will always provide you with a 30 day script (once) to allow you time to schedule.    Medicare wellness (well visit): - we have a wonderful Nurse Maudie Mercury), that will meet with you and provide you will yearly medicare wellness visits. These visits should occur yearly (can not be scheduled less than 1 calendar year apart) and cover preventive health, immunizations, advance directives and screenings you are entitled to yearly through your medicare benefits. Do not miss out on your entitled benefits, this is when medicare will pay for these benefits to be ordered for you.  These are strongly encouraged by your provider and is the appropriate type of visit to make certain you are up to date with all preventive health benefits. If you have not had your medicare wellness exam in the last 12 months, please make certain to schedule one by calling the office and schedule your medicare wellness with Maudie Mercury as soon as possible.   Yearly physical (well visit):  - Adults are recommended to be seen  yearly for physicals. Check with your insurance and date of your last physical, most insurances require one calendar year between physicals. Physicals include all preventive health topics, screenings, medical exam and labs that are appropriate for gender/age and history. You may have fasting labs needed at this visit. This is a well visit (not a sick visit), new problems should not be covered during this visit (see acute visit).  - Pediatric patients are seen more frequently when they are younger. Your provider will advise you on well child visit timing that is appropriate for your their age. - This is not a medicare wellness visit. Medicare wellness exams do not have an exam portion to the visit. Some medicare companies allow for a physical, some do not allow a yearly physical. If your medicare allows a yearly physical you can schedule the medicare wellness with our nurse Maudie Mercury and have your physical with your provider after, on the same day. Please check with insurance for your full benefits.   Late Policy/No Shows:  - all new patients should arrive  15-30 minutes earlier than appointment to allow [place] time  to  obtain all personal demographics,  insurance information and for you to complete office paperwork. - All established patients should arrive 10-15 minutes earlier than appointment time to update all information and be checked in .  - In our best efforts to run on time, if you are late for your appointment you will be asked to either reschedule or if able, we will work you back into the schedule. There will be a wait time to work you back in the schedule,  depending on availability.  - If you are unable to make it to your appointment as scheduled, please call 24 hours ahead of time to allow [place] to fill the time slot with someone else who needs to be seen. If you do not cancel your appointment ahead of time, you may be charged a no show fee.

## 2018-01-31 NOTE — Progress Notes (Signed)
Mary [name] , [birth year], 47 y.o., female MRN: [id number] Patient Care Team    Relationship Specialty Notifications Start End  Ma Hillock, DO PCP - General Family Medicine  03/12/17   Salvadore Dom, MD Consulting Physician Obstetrics and Gynecology  06/20/17     Chief Complaint  Patient presents with  . Wrist Pain    x 1 yr, worse x2 wks, having problems working   . Elbow Pain     Subjective:  Pt returns today for wrist pain. This is a chronic issue and has been evaluated by Ortho, SM and hand surgeon. MRI scheduled for January through [facility]. She states her pain has been worsening. She feels her arm is more swollen and she is having problems even opening doors now. Even using her mouse with her computer is uncomfortable. She takes ibuprofen, but not on daily antiinflammatory. She is wearing her wrist/arm braces. 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 She points to ulnar and radial aspect of arm as location of pain. She is concerned that her pain is not just in the wrist and that is the location the MRI is scheduled to image.   Prior note:  Pt presents for an OV with complaints of right wrist pain  Since 03/2017. She has been been following with Sports med and has received injections and nitro patches. Initially pain was radial aspect, now pain is ulnar aspect and radiates to hand.   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 She has been wearing her arm brace as instructed. She reports even with brushing her hair or teeth she has pain, anything grasping is painful. She has noticed weakness with any weight-such as picking up a dish. She would like referral to orthopedics  Today and actually already made her appt for this week.  She wa seen in the ED last week for issue as well.     Depression screen [facility] 2/9 06/20/2017 03/12/2017 03/12/2017  Decreased  Interest 2 1 0  Down, Depressed, Hopeless 2 0 0  PHQ - 2 Score 4 1 0  Altered sleeping 2 0 -  Tired, decreased energy 2 1 -  Change in appetite 2 1 -  Feeling bad or failure about yourself  1 0 -  Trouble concentrating 0 0 -  Moving slowly or fidgety/restless 0 0 -  Suicidal thoughts 0 0 -  PHQ-9 Score 11 3 -  Difficult doing work/chores - Not difficult at all -    Allergies  Allergen Reactions  . Molds & Smuts Shortness Of Breath   Social History   Tobacco Use  . Smoking status: Never Smoker  . Smokeless tobacco: Never Used  Substance Use Topics  . Alcohol use: No    Frequency: Never   Past Medical History:  Diagnosis Date  . Arthritis    Neck, left ankle, clavicle  . Chickenpox 1987  . Chronic constipation   . Costochondritis   . Depression with somatization   . Erythema nodosum   . Fibroid uterus   . Gastritis 2018  . GERD (gastroesophageal reflux disease)   . Hemorrhoid   . History of PCR DNA positive for HSV1    buttocks area  . Psoriasis   . Rosacea   . Stomach ulcer 2007   Past Surgical History:  Procedure Laterality Date  . COLONOSCOPY WITH  ESOPHAGOGASTRODUODENOSCOPY (EGD)  2007, 2008   Patient reports colonoscopy was unsuccessful secondary to her not being able to tolerate prep. Was being completed for chronic constipation.  . ESOPHAGOGASTRODUODENOSCOPY  06/28/2016   gastritis; gastric antrum  . SALPINGECTOMY Right 2004   ectopic pregnancy  . TONSILLECTOMY  2004   Family History  Problem Relation Age of Onset  . Gout Mother   . Rheum arthritis Mother   . Osteoarthritis Mother   . Breast cancer Mother   . Depression Mother   . Coronary artery disease Mother   . Hyperlipidemia Mother   . Hypertension Mother   . Kidney disease Mother   . Alcohol abuse Father   . Arthritis Father   . Hearing loss Father   . Hyperlipidemia Father   . Hypertension Father   . Psoriasis Father   . Arthritis Brother   . Hyperlipidemia Brother   . Hypertension  Brother   . Psoriasis Brother   . Breast cancer Maternal Grandmother   . Heart murmur Maternal Grandmother   . Breast cancer Paternal Grandmother   . Psoriasis Paternal Grandmother   . Dementia Paternal Grandmother   . Heart attack Paternal Grandfather   . Stroke Paternal Grandfather   . Coronary artery disease Paternal Grandfather    Allergies as of 01/31/2018      Reactions   Molds & Smuts Shortness Of Breath      Medication List       Accurate as of January 31, 2018  2:22 PM. Always use your most recent med list.        ALREX 0.2 % Susp Generic drug:  loteprednol 1 drop 2 (two) times daily as needed.   buPROPion 300 MG 24 hr tablet Commonly known as:  WELLBUTRIN XL Take 1 tablet (300 mg total) by mouth daily.   clobetasol cream 0.05 % Commonly known as:  TEMOVATE Apply 1 application topically 2 (two) times daily.   cyclobenzaprine 5 MG tablet Commonly known as:  FLEXERIL Take 1 tablet (5 mg total) by mouth 3 (three) times daily as needed for muscle spasms.   ibuprofen 200 MG tablet Commonly known as:  ADVIL,MOTRIN Take 400 mg by mouth every 8 (eight) hours as needed.   linaclotide 145 MCG Caps capsule Commonly known as:  LINZESS Take 1 capsule (145 mcg total) by mouth daily before breakfast.   sertraline 50 MG tablet Commonly known as:  ZOLOFT Take 1 tablet (50 mg total) by mouth daily.   tretinoin 0.05 % cream Commonly known as:  RETIN-A Apply topically at bedtime.   valACYclovir 500 MG tablet Commonly known as:  VALTREX Take 1,000 mg by mouth 2 (two) times daily as needed.       All past medical history, surgical history, allergies, family history, immunizations andmedications were updated in the EMR today and reviewed under the history and medication portions of their EMR.     ROS: Negative, with the exception of above mentioned in HPI   Objective:  BP 116/70 (BP Location: Left Arm, Patient Position: Sitting, Cuff Size: Normal)   Pulse 77    Temp 98.1 F (36.7 C) (Oral)   Resp 16   Ht 5\' 5"  (1.651 m)   Wt 163 lb (73.9 kg)   SpO2 98%   BMI 27.12 kg/m  Body mass index is 27.12 kg/m.  Gen: Afebrile. No acute distress. Nontoxic. HENT: AT. [place].  MMM.  Eyes:Pupils Equal Round Reactive to light, Extraocular movements intact,  Conjunctiva without redness, discharge  or icterus. MSK: no erythema, mild swelling appreciated right forearm. Discomfort with ROM in all planes but > in supination. Neg tinels elbow and wrist. + finkelstein. NV intact distally.  Skin: no rashes, purpura or petechiae.  Neuro:  Normal gait. PERLA. EOMi. Alert. Oriented.    No exam data present No results found. No results found for this or any previous visit (from the past 24 hour(s)).  Assessment/Plan: Leyda Vanderwerf is a 47 y.o. female present for OV for  Right wrist pain/elbow pain - chronic wrist pain, not responding to injections, oral meds and bracing by SM.  - She has been seen by ortho, SM and hand specialist. - Work excuse provided for next 3 days.  - start Voltaren BID with food --> follow up with specialist ASAP. Not much more can be offered by primary care.    Reviewed expectations re: course of current medical issues.  Discussed self-management of symptoms.  Outlined signs and symptoms indicating need for more acute intervention.  Patient verbalized understanding and all questions were answered.  Patient received an After-Visit Summary.    No orders of the defined types were placed in this encounter.    Note is dictated utilizing voice recognition software. Although note has been proof read prior to signing, occasional typographical errors still can be missed. If any questions arise, please do not hesitate to call for verification.   electronically signed by:  Howard Pouch, DO  [facility]

## 2018-02-10 ENCOUNTER — Ambulatory Visit (INDEPENDENT_AMBULATORY_CARE_PROVIDER_SITE_OTHER): Payer: No Typology Code available for payment source | Admitting: Family Medicine

## 2018-02-10 ENCOUNTER — Encounter: Payer: Self-pay | Admitting: Family Medicine

## 2018-02-10 ENCOUNTER — Telehealth: Payer: Self-pay | Admitting: Family Medicine

## 2018-02-10 VITALS — BP 102/80 | HR 79 | Ht 65.0 in | Wt 164.0 lb

## 2018-02-10 DIAGNOSIS — M654 Radial styloid tenosynovitis [de Quervain]: Secondary | ICD-10-CM | POA: Diagnosis not present

## 2018-02-10 MED ORDER — GABAPENTIN 100 MG PO CAPS: 100.0000 mg | ORAL_CAPSULE | Freq: Three times a day (TID) | ORAL | 3 refills | Status: DC

## 2018-02-10 MED FILL — GABAPENTIN 100 MG CAPSULE: 100 | 30 days supply | Qty: 90 | Fill #0

## 2018-02-10 NOTE — Patient Instructions (Signed)
Nice to meet you  Please try physical therapy  Please try the gabapentin. This can make you drowsy so please start with one pill nightly and then increase to two or three time daily as you tolerate.  Please try the brace on a regular basis  Please send me a message if you would like for me to look at your MRI  Happy Holidays.

## 2018-02-10 NOTE — Progress Notes (Signed)
Mary [name] - 47 y.o. female MRN [id number]  Date of birth: September 05, 1970  SUBJECTIVE:  Including CC & ROS.  Chief Complaint  Patient presents with  . Elbow Pain    pulling pain from elbow to wrist (ulner) . pain sometimes shooting, achy, throbbing    Mary [name] is a 47 y.o. female that is  Presenting with right forearm and wrist pain.  She reports her symptoms have been ongoing for about a year.  She denies any inciting event.  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  She saw a rheumatologist early this year and had a negative rheumatological work-up.  She was seen by sports medicine and provided with an injection into the first dorsal compartment.  Had some relief with that injection.  She is seen an orthopedist as well as a Copy.  Hand surgeon has placed a MRI arthrogram that is said to be done in January.  She feels like the pain is occurring over the extensors of the forearm as well as the dorsal aspect and radial aspect of the wrist.  The symptoms are intermittent in nature.  They are worse with certain movements.  They are sharp and severe in nature.  She denies any improvement with ibuprofen.  She reports intermittent pain that radiates from her dorsal hand to the shoulder.  She has been using a thumb spica splint with some improvement.  She has a history of psoriasis.  Independent review of the right wrist x-ray from 3/28 shows a normal exam.   Review of Systems  Constitutional: Negative for fever.  HENT: Negative for congestion.   Respiratory: Negative for cough.   Cardiovascular: Negative for chest pain.  Gastrointestinal: Negative for abdominal pain.  Musculoskeletal: Positive for joint swelling.  Skin: Positive for rash.  Neurological: Positive for weakness.  Hematological: Negative for adenopathy.  Psychiatric/Behavioral: Negative for agitation.    HISTORY: Past Medical, Surgical, Social, and Family History Reviewed &  Updated per EMR.   Pertinent Historical Findings include:  Past Medical History:  Diagnosis Date  . Arthritis    Neck, left ankle, clavicle  . Chickenpox 1987  . Chronic constipation   . Costochondritis   . Depression with somatization   . Erythema nodosum   . Fibroid uterus   . Gastritis 2018  . GERD (gastroesophageal reflux disease)   . Hemorrhoid   . History of PCR DNA positive for HSV1    buttocks area  . Psoriasis   . Rosacea   . Stomach ulcer 2007    Past Surgical History:  Procedure Laterality Date  . COLONOSCOPY WITH ESOPHAGOGASTRODUODENOSCOPY (EGD)  2007, 2008   Patient reports colonoscopy was unsuccessful secondary to her not being able to tolerate prep. Was being completed for chronic constipation.  . ESOPHAGOGASTRODUODENOSCOPY  06/28/2016   gastritis; gastric antrum  . SALPINGECTOMY Right 2004   ectopic pregnancy  . TONSILLECTOMY  2004    Allergies  Allergen Reactions  . Molds & Smuts Shortness Of Breath    Family History  Problem Relation Age of Onset  . Gout Mother   . Rheum arthritis Mother   . Osteoarthritis Mother   . Breast cancer Mother   . Depression Mother   . Coronary artery disease Mother   . Hyperlipidemia Mother   . Hypertension Mother   . Kidney disease Mother   . Alcohol abuse Father   . Arthritis Father   . Hearing loss Father   .  Hyperlipidemia Father   . Hypertension Father   . Psoriasis Father   . Arthritis Brother   . Hyperlipidemia Brother   . Hypertension Brother   . Psoriasis Brother   . Breast cancer Maternal Grandmother   . Heart murmur Maternal Grandmother   . Breast cancer Paternal Grandmother   . Psoriasis Paternal Grandmother   . Dementia Paternal Grandmother   . Heart attack Paternal Grandfather   . Stroke Paternal Grandfather   . Coronary artery disease Paternal Grandfather      Social History   Socioeconomic History  . Marital status: Single    Spouse name: Not on file  . Number of children: Not on  file  . Years of education: Not on file  . Highest education level: Not on file  Occupational History  . Not on file  Social Needs  . Financial resource strain: Not on file  . Food insecurity:    Worry: Not on file    Inability: Not on file  . Transportation needs:    Medical: Not on file    Non-medical: Not on file  Tobacco Use  . Smoking status: Never Smoker  . Smokeless tobacco: Never Used  Substance and Sexual Activity  . Alcohol use: No    Frequency: Never  . Drug use: Yes  . Sexual activity: Never    Partners: Male    Birth control/protection: Implant    Comment: nexplanon 09/2015- decline high risk questions  Lifestyle  . Physical activity:    Days per week: Not on file    Minutes per session: Not on file  . Stress: Not on file  Relationships  . Social connections:    Talks on phone: Not on file    Gets together: Not on file    Attends religious service: Not on file    Active member of club or organization: Not on file    Attends meetings of clubs or organizations: Not on file    Relationship status: Not on file  . Intimate partner violence:    Fear of current or ex partner: Not on file    Emotionally abused: Not on file    Physically abused: Not on file    Forced sexual activity: Not on file  Other Topics Concern  . Not on file  Social History Narrative   Single. Bachelor's degree. Works as a traveling Therapist, sports.   Wears a bicycle helmet.   Vegetarian diet.   Drinks caffeine.   Smoke alarm in the home. Wears her seatbelt.   Feels safe in her relationships.     PHYSICAL EXAM:  VS: BP 102/80 (BP Location: Left Arm, Patient Position: Sitting, Cuff Size: Normal)   Pulse 79   Ht 5\' 5"  (1.651 m)   Wt 164 lb (74.4 kg)   SpO2 97%   BMI 27.29 kg/m  Physical Exam Gen: NAD, alert, cooperative with exam, well-appearing ENT: normal lips, normal nasal mucosa,  Eye: normal EOM, normal conjunctiva and lids CV:  no edema, +2 pedal pulses   Resp: no accessory muscle  use, non-labored,  Skin: no rashes, no areas of induration  Neuro: normal tone, normal sensation to touch Psych:  normal insight, alert and oriented MSK:  Right arm: Normal passive range of motion with wrist  Normal strength resistance with thumb hyperextension. Normal pincer grasp. Normal grip strength. Normal strength resistance with finger abduction and adduction. Positive Finkelstein's test. Pain with CMC grind. Normal elbow range of motion. No tenderness palpation of the  lateral epicondyle. Neurovascular intact     ASSESSMENT & PLAN:   De Quervain's tenosynovitis, right Has had ongoing symptoms for about a year now.  And has seen multiple doctors.  Has tried ibuprofen and injection into the first dorsal compartment with little improvement.  Ibuprofen is also added little improvement.  Has a reported negative rheumatologic work-up.  Unclear if this is cervical radiculopathy versus complex regional pain syndrome versus more of a somatic problem. -Initiate gabapentin. -Referral to physical therapy. - Provided a work note with restrictions until her MRI January 9.  Pending the results of the MRI we will determine her ongoing status.  This can either be provided by the hand surgeon who had ordered the MRI unless she is following up with myself.

## 2018-02-10 NOTE — Assessment & Plan Note (Signed)
Has had ongoing symptoms for about a year now.  And has seen multiple doctors.  Has tried ibuprofen and injection into the first dorsal compartment with little improvement.  Ibuprofen is also added little improvement.  Has a reported negative rheumatologic work-up.  Unclear if this is cervical radiculopathy versus complex regional pain syndrome versus more of a somatic problem. -Initiate gabapentin. -Referral to physical therapy. - Provided a work note with restrictions until her MRI January 9.  Pending the results of the MRI we will determine her ongoing status.  This can either be provided by the hand surgeon who had ordered the MRI unless she is following up with myself.

## 2018-02-10 NOTE — Telephone Encounter (Signed)
Fax received from [employer] for FMLA paperwork

## 2018-02-11 NOTE — Telephone Encounter (Signed)
I have received the documents and working on completed them.

## 2018-02-11 NOTE — Telephone Encounter (Signed)
Duplicate fax received today

## 2018-02-14 NOTE — Telephone Encounter (Signed)
Please call pt before filling out FMLA. I want her to be aware I can only excuse her for the 3 days I wrote her excuse for in December. If this is what she needing those forms filled out for, we can do so. There is a charge for FMLA forms to be completed- so I do not want to start this process if she is expecting an extension.  It also appears Dr. Raeford Razor- the last [facility] doctor she saw on 12/23 wrote her a note with restrictions. If she is asking for an extension- he would need to fill out for her

## 2018-02-14 NOTE — Telephone Encounter (Signed)
L/m for pt to CB to let us know which days are the [facility] forms are for.

## 2018-02-14 NOTE — Telephone Encounter (Signed)
Dr. Raoul Pitch saw pt on 01/31/2018 for her wrist pain. See wrote her out of work for 01/31/2018-02/03/2018 able to return to work on 02/04/2018. I will verify w Dr. Raoul Pitch to see if we can fill out her FLMA forms.

## 2018-02-17 NOTE — Telephone Encounter (Signed)
Patient is calling and states December 13th-16th.

## 2018-02-18 NOTE — Telephone Encounter (Signed)
As discussed prior with patient-before filling out her FMLA-  I would only write her out for the days that I excused her initially for this condition and evaluated her which was January 31, 2018 to February 03, 2018.  SHe was not under my care, nor did I recommend she continue her absence from work for the additional days-therefore I cannot write a continuation for her. -She is under the care of sports medicine since that time, most recently Dr. Raeford Razor at [facility] in [place] and a orthopedic/hand surgeon who is continuing evaluation and has imaging scheduled at the beginning of the new year.  If either of those doctors recommended she be out of work, that is who she needs to have fill out her FMLA.  FMLA papers have to come from the doctor that is evaluating and recommends the absence or restrictions from work. Dr. Doristine Locks last note it sounds like he may be willing to do that since he evaluated her and provided her with a "work note with restrictions until her MRI January 9 ".

## 2018-02-18 NOTE — Telephone Encounter (Signed)
Faxed completed forms back to number provided. Received a fax back today stating that pt needs the FLMA dates to be dated from 01/24/2018 to 03/02/18, as provided by the pt to [employer] absence [employer]. We wrote pt out on 01/31/2018 to 02/03/2018, [employer] stated that there was insufficient certification was received. [employer] is now requesting for Dr. Raoul Pitch to approved FMLA from 01/24/2018 to 03/02/18.

## 2018-02-20 NOTE — Telephone Encounter (Signed)
Called pt back after we saw that pt scheduled an appt to see Dr. Raoul Pitch at [facility] tomorrow. Advised pt that Dr. Raoul Pitch recommends that you follow up w Dr. Raeford Razor. I advised that Dr. Raoul Pitch has treated her and doesn't have anymore suggestions for her and advised that she should cancel her appt w Dr. Raoul Pitch and reschedule w Dr. Raeford Razor, regarding completion of her FMLA forms.

## 2018-02-20 NOTE — Telephone Encounter (Signed)
L/M for pt to c/b regarding her FMLA forms.

## 2018-02-21 ENCOUNTER — Ambulatory Visit (INDEPENDENT_AMBULATORY_CARE_PROVIDER_SITE_OTHER): Payer: No Typology Code available for payment source | Admitting: Sports Medicine

## 2018-02-21 ENCOUNTER — Encounter: Payer: Self-pay | Admitting: Sports Medicine

## 2018-02-21 ENCOUNTER — Ambulatory Visit: Payer: Self-pay | Admitting: Family Medicine

## 2018-02-21 VITALS — BP 102/76 | HR 78 | Ht 65.0 in | Wt 163.4 lb

## 2018-02-21 DIAGNOSIS — E559 Vitamin D deficiency, unspecified: Secondary | ICD-10-CM | POA: Diagnosis not present

## 2018-02-21 DIAGNOSIS — M25531 Pain in right wrist: Secondary | ICD-10-CM | POA: Diagnosis not present

## 2018-02-21 DIAGNOSIS — M654 Radial styloid tenosynovitis [de Quervain]: Secondary | ICD-10-CM | POA: Diagnosis not present

## 2018-02-21 LAB — VITAMIN D 25 HYDROXY (VIT D DEFICIENCY, FRACTURES): VITD: 24.28 ng/mL — AB (ref 30.00–100.00)

## 2018-02-21 MED ORDER — CYCLOBENZAPRINE HCL 5 MG PO TABS: 5.0000 mg | ORAL_TABLET | Freq: Three times a day (TID) | ORAL | 1 refills | Status: DC | PRN

## 2018-02-21 MED ORDER — DICLOFENAC SODIUM 2 % TD SOLN: 1.0000 "application " | Freq: Two times a day (BID) | TRANSDERMAL | 0 refills | Status: AC

## 2018-02-21 NOTE — Patient Instructions (Addendum)
Pennsaid instructions: You have been given a sample/prescription for Pennsaid, a topical medication.     You are to apply this gel to your injured body part twice daily (morning and evening).   A little goes a long way so you can use about a pea-sized amount for each area.   Spread this small amount over the area into a thin film and let it dry.   Be sure that you do not rub the gel into your skin for more than 10 or 15 seconds otherwise it can irritate you skin.    Once you apply the gel, please do not put any other lotion or clothing in contact with that area for 30 minutes to allow the gel to absorb into your skin.   Some people are sensitive to the medication and can develop a sunburn-like rash.  If you have only mild symptoms it is okay to continue to use the medication but if you have any breakdown of your skin you should discontinue its use and please let us know.   If you have been written a prescription for Pennsaid, you will receive a pump bottle of this topical gel through a mail order pharmacy.  The instructions on the bottle will say to apply two pumps twice a day which may be too much gel for your particular area so use the pea-sized amount as your guide.  

## 2018-02-21 NOTE — Telephone Encounter (Signed)
Appointment was cancelled. FMLA paperwork denied.

## 2018-02-21 NOTE — Progress Notes (Signed)
Mary [name]. [place], [place] at [facility] [phone]  Mary [name] - 48 y.o. female MRN [id number]  Date of birth: 07-Feb-1971  Visit Date: 02/21/2018  PCP: Ma Hillock, DO   Referred by: Ma Hillock, DO   SUBJECTIVE:   Chief Complaint  Patient presents with  . Follow-up    R wrist pain / DeQuervain's tenosynovitis    HPI: Patient presents as a work in due to lack of availability with Dr. Raeford Razor or Dr. Tamala Julian.  Ultimately she has had an extensive and complicated course with her right wrist.  She has an upcoming MR arthrogram next week.  The below has been reviewed and attested to.  History and Summary from Dr. Doristine Locks office visit note.  Mary [name] is a 48 y.o. female that is  Presenting with right forearm and wrist pain.  She reports her symptoms have been ongoing for about a year.  She denies any inciting event.  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  She saw a rheumatologist early this year and had a negative rheumatological work-up.  She was seen by sports medicine and provided with an injection into the first dorsal compartment.  Had some relief with that injection.  She is seen an orthopedist as well as a Copy.  Hand surgeon has placed a MRI arthrogram that is said to be done in January.  She feels like the pain is occurring over the extensors of the forearm as well as the dorsal aspect and radial aspect of the wrist.  The symptoms are intermittent in nature.  They are worse with certain movements.  They are sharp and severe in nature.  She denies any improvement with ibuprofen.  She reports intermittent pain that radiates from her dorsal hand to the shoulder.  She has been using a thumb spica splint with some improvement.  REVIEW OF SYSTEMS: Reports night time disturbances.  Denies fevers, chills, or night sweats. Denies unexplained weight loss. Denies personal history  of cancer. Denies changes in bowel or bladder habits. Reports, fall on 02/13/2018: She slipped and fell landing on her right arm and elbow. Denies new or worsening dyspnea or wheezing. Reports, chronic headaches or dizziness.  Denies numbness, tingling In the extremities.  But does have some generalized weakness Denies dizziness or presyncopal episodes Reports lower extremity edema    HISTORY:  Prior history reviewed and updated per electronic medical record.  Social History   Occupational History  . Not on file  Tobacco Use  . Smoking status: Never Smoker  . Smokeless tobacco: Never Used  Substance and Sexual Activity  . Alcohol use: No    Frequency: Never  . Drug use: Yes  . Sexual activity: Never    Partners: Male    Birth control/protection: Implant    Comment: nexplanon 09/2015- decline high risk questions   Social History   Social History Narrative   Single. Bachelor's degree. Works as a traveling Therapist, sports.   Wears a bicycle helmet.   Vegetarian diet.   Drinks caffeine.   Smoke alarm in the home. Wears her seatbelt.   Feels safe in her relationships.    Past Medical History:  Diagnosis Date  . Arthritis    Neck, left ankle, clavicle  . Chickenpox 1987  . Chronic constipation   . Costochondritis   . Depression with somatization   . Erythema nodosum   . Fibroid uterus   .  Gastritis 2018  . GERD (gastroesophageal reflux disease)   . Hemorrhoid   . History of PCR DNA positive for HSV1    buttocks area  . Psoriasis   . Rosacea   . Stomach ulcer 2007   Past Surgical History:  Procedure Laterality Date  . COLONOSCOPY WITH ESOPHAGOGASTRODUODENOSCOPY (EGD)  2007, 2008   Patient reports colonoscopy was unsuccessful secondary to her not being able to tolerate prep. Was being completed for chronic constipation.  . ESOPHAGOGASTRODUODENOSCOPY  06/28/2016   gastritis; gastric antrum  . SALPINGECTOMY Right 2004   ectopic pregnancy  . TONSILLECTOMY  2004   family  history includes Alcohol abuse in her father; Arthritis in her brother and father; Breast cancer in her maternal grandmother, mother, and paternal grandmother; Coronary artery disease in her mother and paternal grandfather; Dementia in her paternal grandmother; Depression in her mother; Gout in her mother; Hearing loss in her father; Heart attack in her paternal grandfather; Heart murmur in her maternal grandmother; Hyperlipidemia in her brother, father, and mother; Hypertension in her brother, father, and mother; Kidney disease in her mother; Osteoarthritis in her mother; Psoriasis in her brother, father, and paternal grandmother; Rheum arthritis in her mother; Stroke in her paternal grandfather.  DATA OBTAINED & REVIEWED:   Recent Labs    06/20/17 0852  HGBA1C 5.1  CALCIUM 9.2  AST 10  ALT 9  TSH 2.45   Problem  De Quervain's Tenosynovitis, Right   Injected Jul 03, 2017 Followed by Dr. Burney Gauze    No specialty comments available.  OBJECTIVE:  VS:  HT:5\' 5"  (165.1 cm)   WT:163 lb 6.4 oz (74.1 kg)  BMI:27.19    BP:102/76  HR:78bpm  TEMP: ( )  RESP:96 %   PHYSICAL EXAM: Adult female.  No acute distress although anxious.    Right hand has minimal swelling.  She has normal passive range of motion at the wrist and thumb.  She does have a positive Finkelstein test and pain with [facility] grind.  Normal flexion-extension of the elbow. Good capillary refill.  No overlying dystrophic changes.   ASSESSMENT   1. De Quervain's tenosynovitis, right   2. Right wrist pain   3. Vitamin D deficiency     PLAN:  Pertinent additional documentation may be included in corresponding procedure notes, imaging studies, problem based documentation and patient instructions.  Procedures:  None  Medications:  Meds ordered this encounter  Medications  . cyclobenzaprine (FLEXERIL) 5 MG tablet    Sig: Take 1 tablet (5 mg total) by mouth 3 (three) times daily as needed for muscle spasms.    Dispense:   30 tablet    Refill:  1  . Diclofenac Sodium (PENNSAID) 2 % SOLN    Sig: Place 1 application onto the skin 2 (two) times daily for 1 day.    Dispense:  8 g    Refill:  0  . Cholecalciferol 1.25 MG (50000 UT) TABS    Sig: Take 1 tablet by mouth once a week.    Dispense:  12 tablet    Refill:  0    Do not substitute ergocalciferol    Discussion/Instructions: De Quervain's tenosynovitis, right Injected Jul 03, 2017 Has been seen by Dr. Raoul Pitch, and Dr. Burney Gauze who is recommended MRI of the wrist.  Due to ongoing pain was seen by Dr. Raeford Razor as well as Dr. Paulla Fore.  Work modifications limiting the use of the right upper extremity has been recommended and Dr. Raeford Razor did provide a work  excuse note on 1223.  It has been recommended that further recommendations come from Dr. Burney Gauze  Symptomatic treatment tried today with topical anti-inflammatories as well as recommending continue to use the thumb spica splint.  Given the duration and severity of her symptoms and nonclassical presentation expert opinion from hand surgery encouraged to be followed up with.  She additionally has a history of vitamin D deficiency and is concerned that this may be coming back since she is having worsening generalized body aches.  She is interested in having her vitamin D rechecked and this is provided today.  Trial of Flexeril given the generalized muscle tightness in her entire right upper extremity due to guarding.  >50% of this 25 minute visit spent in direct patient counseling and/or coordination of care.  Discussion was focused on education regarding the in discussing the pathoetiology and anticipated clinical course of the above condition.  Return if symptoms worsen or fail to improve.          Gerda Diss, [facility] Physician

## 2018-02-24 NOTE — Telephone Encounter (Signed)
Pt called in to request to have note sent to [employer] that excused her for the 3 days that Dr. Raoul Pitch approved her for.

## 2018-02-25 ENCOUNTER — Ambulatory Visit (INDEPENDENT_AMBULATORY_CARE_PROVIDER_SITE_OTHER): Payer: No Typology Code available for payment source | Admitting: Clinical

## 2018-02-25 DIAGNOSIS — F331 Major depressive disorder, recurrent, moderate: Secondary | ICD-10-CM

## 2018-02-25 MED ORDER — CHOLECALCIFEROL 1.25 MG (50000 UT) PO TABS: 1.0000 | ORAL_TABLET | ORAL | 0 refills | Status: DC

## 2018-02-25 MED FILL — VITAMIN D3 50000 UNIT CAPS: 1.25 MG | 84 days supply | Qty: 12 | Fill #0

## 2018-02-25 NOTE — Progress Notes (Signed)
My Chart Comment: Your Vit D is slightly low. Will send in 12 week course of VitD to the [facility].

## 2018-02-26 NOTE — Telephone Encounter (Signed)
LMOM for pt to CB-  Does she want just a work note for the three days or FMLA paperwork for three days?  Please gain information when patient returns call.

## 2018-02-26 NOTE — Telephone Encounter (Signed)
Called patient and left a detailed voicemail on her phone stating the below information from Dr. Raoul Pitch.  Advised the patient that we did complete her original FMLA paperwork for the dates of December 13-16. [employer] denied that paperwork and they require the dates to reflect December 13 through January 12. Dr. Raoul Pitch would not be able to complete this as she did not agree to those dates and the patient saw specialist in that timeframe.  If patient needs December 13 through January 12 to be the dates for her FMLA, she will need to contact the specialists that treated her in that timeframe. No further FMLA paperwork to be completed by this office.

## 2018-02-26 NOTE — Telephone Encounter (Signed)
Discussed with patient, she would like FMLA filled out for the 3 days in December.  Do you still have paper work?

## 2018-02-26 NOTE — Telephone Encounter (Signed)
Please see extensive phone note trail on this matter. I did complete and submitted FMLA paperwork the day she asked for the dates of 12/13-16/2019. We received the paperwork back by the company 02/17/18 stating it was denied, because they needed the dates to reflect 01/31/2018-03/02/18.  The patient is aware and agreed prior to paperwork being completed, that I was only able to write her out for the dates of 12/13-16/2019. We completed what we agreed upon and the company has those papers.   Staff should have scanned copy of the completed papers

## 2018-02-27 ENCOUNTER — Ambulatory Visit (HOSPITAL_COMMUNITY)
Admission: RE | Admit: 2018-02-27 | Discharge: 2018-02-27 | Disposition: A | Discharge status: Home / Self Care | Payer: No Typology Code available for payment source | Source: Ambulatory Visit | Attending: Orthopedic Surgery | Admitting: Orthopedic Surgery

## 2018-02-27 DIAGNOSIS — M25531 Pain in right wrist: Secondary | ICD-10-CM

## 2018-02-27 MED ORDER — SODIUM CHLORIDE (PF) 0.9 % IJ SOLN
INTRAMUSCULAR | Status: AC
  Filled 2018-02-27: qty 10

## 2018-02-27 MED ORDER — LIDOCAINE HCL (PF) 1 % IJ SOLN: 5.0000 mL | Freq: Once | INTRAMUSCULAR | Status: DC

## 2018-02-27 MED ORDER — LIDOCAINE HCL (PF) 1 % IJ SOLN
INTRAMUSCULAR | Status: AC
  Filled 2018-02-27: qty 5

## 2018-02-27 MED ORDER — IOPAMIDOL (ISOVUE-M 200) INJECTION 41%
10.0000 mL | Freq: Once | INTRAMUSCULAR | Status: AC
  Administered 2018-02-27: 10 mL via INTRA_ARTICULAR

## 2018-02-27 MED ORDER — IOPAMIDOL (ISOVUE-M 200) INJECTION 41%
INTRAMUSCULAR | Status: AC
  Filled 2018-02-27: qty 10

## 2018-02-27 MED ORDER — GADOBENATE DIMEGLUMINE 529 MG/ML IV SOLN
0.5000 mL | Freq: Once | INTRAVENOUS | Status: AC | PRN
  Administered 2018-02-27: 0.5 mL via INTRAVENOUS

## 2018-03-02 ENCOUNTER — Encounter: Payer: Self-pay | Admitting: Sports Medicine

## 2018-03-02 NOTE — Assessment & Plan Note (Signed)
Injected Jul 03, 2017 Has been seen by Dr. Raoul Pitch, and Dr. Burney Gauze who is recommended MRI of the wrist.  Due to ongoing pain was seen by Dr. Raeford Razor as well as Dr. Paulla Fore.  Work modifications limiting the use of the right upper extremity has been recommended and Dr. Raeford Razor did provide a work excuse note on 1223.  It has been recommended that further recommendations come from Dr. Burney Gauze  Symptomatic treatment tried today with topical anti-inflammatories as well as recommending continue to use the thumb spica splint.  Given the duration and severity of her symptoms and nonclassical presentation expert opinion from hand surgery encouraged to be followed up with.

## 2018-03-05 ENCOUNTER — Ambulatory Visit (INDEPENDENT_AMBULATORY_CARE_PROVIDER_SITE_OTHER): Payer: No Typology Code available for payment source | Admitting: Family Medicine

## 2018-03-05 ENCOUNTER — Ambulatory Visit: Payer: Self-pay | Admitting: Family Medicine

## 2018-03-05 ENCOUNTER — Encounter: Payer: Self-pay | Admitting: Family Medicine

## 2018-03-05 DIAGNOSIS — M654 Radial styloid tenosynovitis [de Quervain]: Secondary | ICD-10-CM | POA: Diagnosis not present

## 2018-03-05 NOTE — Assessment & Plan Note (Signed)
MRI was demonstrating she likely has a component of de Quervain's.  She initially got response with the injection last May.  She has follow-up with a hand surgeon tomorrow so encouraged her to follow through with that.   -Discussed with her today that I can fill out FMLA from December 23 through January 9.

## 2018-03-05 NOTE — Progress Notes (Signed)
Mary [name] - 48 y.o. female MRN [id number]  Date of birth: 03-Dec-1970  SUBJECTIVE:  Including CC & ROS.  No chief complaint on file.   Mary [name] is a 48 y.o. female that is is following up after her MRI.  This shows signs of de Quervain's tenosynovitis, suspect a ganglion cyst and different levels of degenerative changes within the carpal bones.  She has been out of work for the past month.  She is requesting if any FMLA can be completed.   Review of Systems  Constitutional: Negative for fever.  HENT: Negative for congestion.   Respiratory: Negative for cough.   Hematological: Negative for adenopathy.  Psychiatric/Behavioral: Negative for agitation.    HISTORY: Past Medical, Surgical, Social, and Family History Reviewed & Updated per EMR.   Pertinent Historical Findings include:  Past Medical History:  Diagnosis Date  . Arthritis    Neck, left ankle, clavicle  . Chickenpox 1987  . Chronic constipation   . Costochondritis   . Depression with somatization   . Erythema nodosum   . Fibroid uterus   . Gastritis 2018  . GERD (gastroesophageal reflux disease)   . Hemorrhoid   . History of PCR DNA positive for HSV1    buttocks area  . Psoriasis   . Rosacea   . Stomach ulcer 2007    Past Surgical History:  Procedure Laterality Date  . COLONOSCOPY WITH ESOPHAGOGASTRODUODENOSCOPY (EGD)  2007, 2008   Patient reports colonoscopy was unsuccessful secondary to her not being able to tolerate prep. Was being completed for chronic constipation.  . ESOPHAGOGASTRODUODENOSCOPY  06/28/2016   gastritis; gastric antrum  . SALPINGECTOMY Right 2004   ectopic pregnancy  . TONSILLECTOMY  2004    Allergies  Allergen Reactions  . Molds & Smuts Shortness Of Breath    Family History  Problem Relation Age of Onset  . Gout Mother   . Rheum arthritis Mother   . Osteoarthritis Mother   . Breast cancer Mother   . Depression Mother   . Coronary artery disease Mother   .  Hyperlipidemia Mother   . Hypertension Mother   . Kidney disease Mother   . Alcohol abuse Father   . Arthritis Father   . Hearing loss Father   . Hyperlipidemia Father   . Hypertension Father   . Psoriasis Father   . Arthritis Brother   . Hyperlipidemia Brother   . Hypertension Brother   . Psoriasis Brother   . Breast cancer Maternal Grandmother   . Heart murmur Maternal Grandmother   . Breast cancer Paternal Grandmother   . Psoriasis Paternal Grandmother   . Dementia Paternal Grandmother   . Heart attack Paternal Grandfather   . Stroke Paternal Grandfather   . Coronary artery disease Paternal Grandfather      Social History   Socioeconomic History  . Marital status: Single    Spouse name: Not on file  . Number of children: Not on file  . Years of education: Not on file  . Highest education level: Not on file  Occupational History  . Not on file  Social Needs  . Financial resource strain: Not on file  . Food insecurity:    Worry: Not on file    Inability: Not on file  . Transportation needs:    Medical: Not on file    Non-medical: Not on file  Tobacco Use  . Smoking status: Never Smoker  . Smokeless tobacco: Never Used  Substance and Sexual Activity  .  Alcohol use: No    Frequency: Never  . Drug use: Yes  . Sexual activity: Never    Partners: Male    Birth control/protection: Implant    Comment: nexplanon 09/2015- decline high risk questions  Lifestyle  . Physical activity:    Days per week: Not on file    Minutes per session: Not on file  . Stress: Not on file  Relationships  . Social connections:    Talks on phone: Not on file    Gets together: Not on file    Attends religious service: Not on file    Active member of club or organization: Not on file    Attends meetings of clubs or organizations: Not on file    Relationship status: Not on file  . Intimate partner violence:    Fear of current or ex partner: Not on file    Emotionally abused: Not on  file    Physically abused: Not on file    Forced sexual activity: Not on file  Other Topics Concern  . Not on file  Social History Narrative   Single. Bachelor's degree. Works as a traveling Therapist, sports.   Wears a bicycle helmet.   Vegetarian diet.   Drinks caffeine.   Smoke alarm in the home. Wears her seatbelt.   Feels safe in her relationships.     PHYSICAL EXAM:  VS: BP 112/80   Pulse 83   Temp 98.4 F (36.9 C) (Oral)   Ht 5\' 5"  (1.651 m)   SpO2 97%   BMI 27.19 kg/m  Physical Exam Gen: NAD, alert, cooperative with exam, well-appearing ENT: normal lips, normal nasal mucosa,  Eye: normal EOM, normal conjunctiva and lids CV:  no edema, +2 pedal pulses   Resp: no accessory muscle use, non-labored,  Skin: no rashes, no areas of induration  Neuro: normal tone, normal sensation to touch Psych:  normal insight, alert and oriented MSK:  Right wrist: No signs of atrophy. Some tenderness palpation of the first dorsal compartment. Normal range of motion of the thumb. Neurovascular intact     ASSESSMENT & PLAN:   De Quervain's tenosynovitis, right MRI was demonstrating she likely has a component of de Quervain's.  She initially got response with the injection last May.  She has follow-up with a hand surgeon tomorrow so encouraged her to follow through with that.   -Discussed with her today that I can fill out FMLA from December 23 through January 9.

## 2018-03-05 NOTE — Patient Instructions (Signed)
Good to see you  Please continue the brace  Please continue the pennsaid  Please follow up with the hand surgeon

## 2018-03-06 ENCOUNTER — Telehealth: Payer: Self-pay | Admitting: Family Medicine

## 2018-03-06 NOTE — Telephone Encounter (Signed)
Left VM for patient. If she calls back please have her speak with a nurse/CMA and inform that her FMLA paperwork is completed and at the front desk at [facility]. The [facility] can report results to patient.   If any questions then please take the best time and phone number to call and I will try to call her back.   Rosemarie Ax, MD [facility] 03/06/2018, 2:44 PM

## 2018-03-07 ENCOUNTER — Ambulatory Visit: Payer: No Typology Code available for payment source | Admitting: Physical Therapy

## 2018-03-11 ENCOUNTER — Ambulatory Visit: Payer: No Typology Code available for payment source | Admitting: Physical Therapy

## 2018-03-11 ENCOUNTER — Ambulatory Visit: Payer: No Typology Code available for payment source | Admitting: Clinical

## 2018-03-13 ENCOUNTER — Ambulatory Visit: Payer: No Typology Code available for payment source | Admitting: Physical Therapy

## 2018-03-25 ENCOUNTER — Ambulatory Visit: Payer: No Typology Code available for payment source | Admitting: Clinical

## 2018-04-01 ENCOUNTER — Other Ambulatory Visit: Payer: Self-pay | Admitting: Family Medicine

## 2018-04-01 DIAGNOSIS — F32A Depression, unspecified: Secondary | ICD-10-CM

## 2018-04-01 DIAGNOSIS — F329 Major depressive disorder, single episode, unspecified: Secondary | ICD-10-CM

## 2018-04-01 DIAGNOSIS — F450 Somatization disorder: Secondary | ICD-10-CM

## 2018-04-01 NOTE — Telephone Encounter (Signed)
RF request for Bupropion  LOV: 06/20/17 Mary [name], 01/31/18 OV for Wrist pain Next ov: Not one scheduled  Last written: 06/20/17 #90 1 refill   RF request for Zoloft  LOV: 06/20/17 [facility], 01/31/18 OV for Wrist pain Next ov: Not one scheduled  Last written: 06/20/17 #90 1 refill    Please Advise

## 2018-04-02 NOTE — Telephone Encounter (Signed)
If pt has not been seen in over 6 months for her depression/anxiety--> and does not have an appt scheduled soon, then please call pt- schedule appt and provide 30 days refill on zoloft and Wellbutrin.

## 2018-04-03 ENCOUNTER — Other Ambulatory Visit: Payer: Self-pay | Admitting: Family Medicine

## 2018-04-03 DIAGNOSIS — F450 Somatization disorder: Secondary | ICD-10-CM

## 2018-04-03 DIAGNOSIS — F32A Depression, unspecified: Secondary | ICD-10-CM

## 2018-04-03 DIAGNOSIS — F329 Major depressive disorder, single episode, unspecified: Secondary | ICD-10-CM

## 2018-04-03 MED FILL — buPROPion HCL ER (XL) 300 M: 300 | 90 days supply | Qty: 90 | Fill #0

## 2018-04-03 MED FILL — TRETINOIN 0.05 % CREA: 0.05 | 20 days supply | Qty: 45 | Fill #1

## 2018-04-03 NOTE — Telephone Encounter (Signed)
Tried to call pts cell phone and was unable to leave a message. MY Chart message was sent to pt letting her know we could send a 30 day RX in but she would need to schedule an appt. Will try to call pt again if message has not been read by patient.

## 2018-04-04 NOTE — Telephone Encounter (Signed)
Tried to call pt on cell phone, no answer and was unable to leave a VM

## 2018-04-08 ENCOUNTER — Encounter: Payer: Self-pay | Admitting: Family Medicine

## 2018-04-08 ENCOUNTER — Ambulatory Visit (INDEPENDENT_AMBULATORY_CARE_PROVIDER_SITE_OTHER): Payer: No Typology Code available for payment source | Admitting: Clinical

## 2018-04-08 ENCOUNTER — Ambulatory Visit (INDEPENDENT_AMBULATORY_CARE_PROVIDER_SITE_OTHER): Payer: No Typology Code available for payment source | Admitting: Family Medicine

## 2018-04-08 VITALS — BP 113/72 | HR 72 | Temp 98.0°F | Resp 16 | Ht 65.0 in | Wt 165.4 lb

## 2018-04-08 DIAGNOSIS — F32A Depression, unspecified: Secondary | ICD-10-CM

## 2018-04-08 DIAGNOSIS — F450 Somatization disorder: Secondary | ICD-10-CM | POA: Diagnosis not present

## 2018-04-08 DIAGNOSIS — F332 Major depressive disorder, recurrent severe without psychotic features: Secondary | ICD-10-CM

## 2018-04-08 DIAGNOSIS — F329 Major depressive disorder, single episode, unspecified: Secondary | ICD-10-CM | POA: Insufficient documentation

## 2018-04-08 DIAGNOSIS — M25531 Pain in right wrist: Secondary | ICD-10-CM | POA: Diagnosis not present

## 2018-04-08 MED ORDER — BUPROPION HCL ER (XL) 300 MG PO TB24: 300.0000 mg | ORAL_TABLET | Freq: Every day | ORAL | 1 refills | Status: DC

## 2018-04-08 MED ORDER — VENLAFAXINE HCL ER 37.5 MG PO CP24: ORAL_CAPSULE | ORAL | 0 refills | Status: DC

## 2018-04-08 MED FILL — VENLAFAXINE HCL ER 37.5 MG: 37.5 | 30 days supply | Qty: 46 | Fill #0

## 2018-04-08 NOTE — Progress Notes (Signed)
Mary [name] , Jan 04, 1971, 48 y.o., female MRN: [id number] Patient Care Team    Relationship Specialty Notifications Start End  Ma Hillock, DO PCP - General Family Medicine  03/12/17   Salvadore Dom, MD Consulting Physician Obstetrics and Gynecology  06/20/17   Gerda Diss, DO Consulting Physician Sports Medicine  02/21/18     Chief Complaint  Patient presents with  . Depression    Pt has had depression since November and it is getting worse. She is unsure if it is seasonal or not. Therapist thought Gabapentin could be contributing to depression, pt states she has stopped using for right now.  . Referral    Pt saw ortho MD for right wrist and was referred to surgeon but did not care for him. Surgeon wanted to do surgery but pt does not want surgery. They ordered PT but patient has not done that yet. She would like referral to Dr Roseanne Kaufman for a second opinion     Subjective: Pt presents for an OV with complaints of worsening depression.  She reports she seems to get worse in the winter, however this winter it is more severe.  She has continued therapy with psychology-Dr. Gaynell Face.  She reports compliance with Wellbutrin 300 mg daily and Zoloft 50 mg daily.  She states she was also tried on Cymbalta in the past with either a side effect or it was not effective.  She reports she is sleepy all the time, does not have motivation.  She is concerned about increasing her depression regimen because she does not want to gain any more weight as a side effect.  Prior note: Patient reports she has been on Wellbutrin 300 mg daily for little over a year. She had been on this medication approximately 10 years ago as well. She thought it was working well for her toe she went to her recent patch when changing employers. She was started on Zoloft 25 mg in addition to the Wellbutrin at that time. She feels the medication with the addition of Zoloft is working well for her and would like  to continue both medications. Depression screen [facility] 2/9 04/08/2018 06/20/2017 03/12/2017 03/12/2017  Decreased Interest 2 2 1  0  Down, Depressed, Hopeless 2 2 0 0  PHQ - 2 Score 4 4 1  0  Altered sleeping 2 2 0 -  Tired, decreased energy 3 2 1  -  Change in appetite 2 2 1  -  Feeling bad or failure about yourself  0 1 0 -  Trouble concentrating 1 0 0 -  Moving slowly or fidgety/restless 0 0 0 -  Suicidal thoughts 0 0 0 -  PHQ-9 Score 12 11 3  -  Difficult doing work/chores Somewhat difficult - Not difficult at all -   GAD 7 : Generalized Anxiety Score 04/08/2018 06/20/2017 03/12/2017  Nervous, Anxious, on Edge 0 0 0  Control/stop worrying 2 0 0  Worry too much - different things 2 0 0  Trouble relaxing 1 2 0  Restless 0 1 0  Easily annoyed or irritable 0 0 0  Afraid - awful might happen 1 0 0  Total GAD 7 Score 6 3 0  Anxiety Difficulty - - Not difficult at all     Allergies  Allergen Reactions  . Molds & Smuts Shortness Of Breath   Social History   Social History Narrative   Single. Bachelor's degree. Works as a traveling Therapist, sports.   Wears a bicycle helmet.   Vegetarian  diet.   Drinks caffeine.   Smoke alarm in the home. Wears her seatbelt.   Feels safe in her relationships.   Past Medical History:  Diagnosis Date  . Arthritis    Neck, left ankle, clavicle  . Chickenpox 1987  . Chronic constipation   . Costochondritis   . Depression with somatization   . Erythema nodosum   . Fibroid uterus   . Gastritis 2018  . GERD (gastroesophageal reflux disease)   . Hemorrhoid   . History of PCR DNA positive for HSV1    buttocks area  . Psoriasis   . Rosacea   . Stomach ulcer 2007   Past Surgical History:  Procedure Laterality Date  . COLONOSCOPY WITH ESOPHAGOGASTRODUODENOSCOPY (EGD)  2007, 2008   Patient reports colonoscopy was unsuccessful secondary to her not being able to tolerate prep. Was being completed for chronic constipation.  . ESOPHAGOGASTRODUODENOSCOPY  06/28/2016    gastritis; gastric antrum  . SALPINGECTOMY Right 2004   ectopic pregnancy  . TONSILLECTOMY  2004   Family History  Problem Relation Age of Onset  . Gout Mother   . Rheum arthritis Mother   . Osteoarthritis Mother   . Breast cancer Mother   . Depression Mother   . Coronary artery disease Mother   . Hyperlipidemia Mother   . Hypertension Mother   . Kidney disease Mother   . Alcohol abuse Father   . Arthritis Father   . Hearing loss Father   . Hyperlipidemia Father   . Hypertension Father   . Psoriasis Father   . Arthritis Brother   . Hyperlipidemia Brother   . Hypertension Brother   . Psoriasis Brother   . Breast cancer Maternal Grandmother   . Heart murmur Maternal Grandmother   . Breast cancer Paternal Grandmother   . Psoriasis Paternal Grandmother   . Dementia Paternal Grandmother   . Heart attack Paternal Grandfather   . Stroke Paternal Grandfather   . Coronary artery disease Paternal Grandfather    Allergies as of 04/08/2018      Reactions   Molds & Smuts Shortness Of Breath      Medication List       Accurate as of April 08, 2018 11:59 PM. Always use your most recent med list.        ALREX 0.2 % Susp Generic drug:  loteprednol 1 drop 2 (two) times daily as needed.   buPROPion 300 MG 24 hr tablet Commonly known as:  WELLBUTRIN XL Take 1 tablet (300 mg total) by mouth daily.   Cholecalciferol 1.25 MG (50000 UT) Tabs Take 1 tablet by mouth once a week.   clobetasol cream 0.05 % Commonly known as:  TEMOVATE Apply 1 application topically 2 (two) times daily.   cyclobenzaprine 5 MG tablet Commonly known as:  FLEXERIL Take 1 tablet (5 mg total) by mouth 3 (three) times daily as needed for muscle spasms.   diclofenac 75 MG EC tablet Commonly known as:  VOLTAREN Take 1 tablet (75 mg total) by mouth 2 (two) times daily.   gabapentin 100 MG capsule Commonly known as:  NEURONTIN Take 100 mg by mouth 2 (two) times daily.   ibuprofen 200 MG  tablet Commonly known as:  ADVIL,MOTRIN Take 400 mg by mouth every 8 (eight) hours as needed.   linaclotide 145 MCG Caps capsule Commonly known as:  LINZESS Take 1 capsule (145 mcg total) by mouth daily before breakfast.   tretinoin 0.05 % cream Commonly known as:  RETIN-A  Apply topically at bedtime.   valACYclovir 500 MG tablet Commonly known as:  VALTREX Take 1,000 mg by mouth 2 (two) times daily as needed.   venlafaxine XR 37.5 MG 24 hr capsule Commonly known as:  EFFEXOR-XR 1 tab for 2 weeks, then 2 tabs daily.       All past medical history, surgical history, allergies, family history, immunizations andmedications were updated in the EMR today and reviewed under the history and medication portions of their EMR.     ROS: Negative, with the exception of above mentioned in HPI   Objective:  BP 113/72 (BP Location: Left Arm, Patient Position: Sitting, Cuff Size: Normal)   Pulse 72   Temp 98 F (36.7 C) (Oral)   Resp 16   Ht 5\' 5"  (1.651 m)   Wt 165 lb 6 oz (75 kg)   SpO2 98%   BMI 27.52 kg/m  Body mass index is 27.52 kg/m. Gen: Afebrile. No acute distress. Nontoxic in appearance, well developed, well nourished.  HENT: AT. [place]. MMM Eyes:Pupils Equal Round Reactive to light, Extraocular movements intact,  Conjunctiva without redness, discharge or icterus. Neuro:  Normal gait. PERLA. EOMi. Alert. Oriented x3  Psych: Appears tired, sometimes tearful, otherwise normal affect, dress and demeanor. Normal speech. Normal thought content and judgment.  No exam data present No results found. No results found for this or any previous visit (from the past 24 hour(s)).  Assessment/Plan: Mary [name] is a 48 y.o. female present for OV for  Depression with somatization/Major depressive disorder, remission status unspecified, unspecified whether recurrent -Discussed different options with her today to improve her management of depression. -Continue Wellbutrin 300 mg  daily. -Discontinue Zoloft and start Effexor taper to 75 mg.  Patient was educated that Effexor must be taken daily without abrupt stop. - buPROPion (WELLBUTRIN XL) 300 MG 24 hr tablet; Take 1 tablet (300 mg total) by mouth daily.  Dispense: 90 tablet; Refill: 1 -Follow-up 4 weeks  Right wrist pain Longstanding history with right wrist pain.  Has been seen by sports medicine and orthopedics.  Per her report last orthopedic suggested surgery but she would like a second opinion and consider.  Has not yet started physical therapy Records of all work-up thus far elbow in EMR/care everywhere tabs - Ambulatory referral to Hand Surgery-request Dr. Amedeo Plenty   Reviewed expectations re: course of current medical issues.  Discussed self-management of symptoms.  Outlined signs and symptoms indicating need for more acute intervention.  Patient verbalized understanding and all questions were answered.  Patient received an After-Visit Summary.    Orders Placed This Encounter  Procedures  . Ambulatory referral to Hand Surgery    > 25 minutes spent with patient, >50% of time spent face to face counseling   Note is dictated utilizing voice recognition software. Although note has been proof read prior to signing, occasional typographical errors still can be missed. If any questions arise, please do not hesitate to call for verification.   electronically signed by:  Howard Pouch, DO  [place]

## 2018-04-08 NOTE — Patient Instructions (Addendum)
Discontinue zoloft- start the Effexor 1 tab the day after your last zoloft--> after 14 days increase it to 2 tabs (at same time). Never stop effexor abruptly - we need to taper off.  I will place your referral in for you.  followup 4 weeks.  I hope this helps.    Major Depressive Disorder, Adult Major depressive disorder (MDD) is a mental health condition. MDD often makes you feel sad, hopeless, or helpless. MDD can also cause symptoms in your body. MDD can affect your:  Work.  School.  Relationships.  Other normal activities. MDD can range from mild to very bad. It may occur once (single episode MDD). It can also occur many times (recurrent MDD). The main symptoms of MDD often include:  Feeling sad, depressed, or irritable most of the time.  Loss of interest. MDD symptoms also include:  Sleeping too much or too little.  Eating too much or too little.  A change in your weight.  Feeling tired (fatigue) or having low energy.  Feeling worthless.  Feeling guilty.  Trouble making decisions.  Trouble thinking clearly.  Thoughts of suicide or harming others.  Feeling weak.  Feeling agitated.  Keeping yourself from being around other people (isolation). Follow these instructions at home: Activity  Do these things as told by your doctor: ? Go back to your normal activities. ? Exercise regularly. ? Spend time outdoors. Alcohol  Talk with your doctor about how alcohol can affect your antidepressant medicines.  Do not drink alcohol. Or, limit how much alcohol you drink. ? This means no more than 1 drink a day for nonpregnant women and 2 drinks a day for men. One drink equals one of these:  12 oz of beer.  5 oz of wine.  1 oz of hard liquor. General instructions  Take over-the-counter and prescription medicines only as told by your doctor.  Eat a healthy diet.  Get plenty of sleep.  Find activities that you enjoy. Make time to do them.  Think about  joining a support group. Your doctor may be able to suggest a group for you.  Keep all follow-up visits as told by your doctor. This is important. Where to find more information:  [employer] on Mental Illness: ? www.nami.[place]: ? https://carter.com/  National Suicide Prevention Lifeline: ? (316)192-6245. This is free, 24-hour help. Contact a doctor if:  Your symptoms get worse.  You have new symptoms. Get help right away if:  You self-harm.  You see, hear, taste, smell, or feel things that are not present (hallucinate). If you ever feel like you may hurt yourself or others, or have thoughts about taking your own life, get help right away. You can go to your nearest emergency department or call:  Your local emergency services (911 in the [place]  A suicide crisis helpline, such as the National Suicide Prevention Lifeline: ? (206)516-5289. This is open 24 hours a day. This information is not intended to replace advice given to you by your health care provider. Make sure you discuss any questions you have with your health care provider. Document Released: 01/17/2015 Document Revised: 10/23/2015 Document Reviewed: 10/23/2015 Elsevier Interactive Patient Education  2019 [employer].

## 2018-04-09 ENCOUNTER — Encounter: Payer: Self-pay | Admitting: Family Medicine

## 2018-04-11 ENCOUNTER — Telehealth: Payer: Self-pay | Admitting: Family Medicine

## 2018-04-11 ENCOUNTER — Telehealth: Payer: Self-pay

## 2018-04-11 DIAGNOSIS — L409 Psoriasis, unspecified: Secondary | ICD-10-CM

## 2018-04-11 NOTE — Telephone Encounter (Signed)
Copied from [place] [id number]. Topic: Referral - Request for Referral >> Apr 11, 2018  8:22 AM Virl Axe D wrote: Has patient seen PCP for this complaint? Yes *If NO, is insurance requiring patient see PCP for this issue before PCP can refer them? Referral for which specialty: Dermatologist Preferred provider/office: In network (Prefers not to be referred back to Dr. Elvera Lennox) Reason for referral: Psoriasis

## 2018-04-11 NOTE — Telephone Encounter (Signed)
Advised patient she would need an appointment for this, not just a lab only visit.  Patient voiced understanding and scheduled 04/18/2018 @ 2:45pm.

## 2018-04-11 NOTE — Telephone Encounter (Signed)
Copied from [place] [phone]. Topic: Quick Communication - See Telephone Encounter >> Apr 11, 2018  3:22 PM Rayann Heman wrote: CRM for notification. See Telephone encounter for: 04/11/18. Pt called and stated that she would like a theroid panel blood work done because she has been very tired and sleeping a lot. Pt states that she is gaining weight and losing hair. Pt would like a call back regarding.

## 2018-04-11 NOTE — Telephone Encounter (Signed)
Pt has focus plan and needs referral to Dermatology and prefers to not see Dr Elvera Lennox.   Referral pending. Please review and advise.

## 2018-04-14 MED FILL — AZELAIC ACID 15 % GEL: 15 | 30 days supply | Qty: 50 | Fill #0

## 2018-04-17 NOTE — Telephone Encounter (Signed)
signed

## 2018-04-18 ENCOUNTER — Ambulatory Visit (INDEPENDENT_AMBULATORY_CARE_PROVIDER_SITE_OTHER): Payer: No Typology Code available for payment source | Admitting: Family Medicine

## 2018-04-18 ENCOUNTER — Encounter: Payer: Self-pay | Admitting: Family Medicine

## 2018-04-18 VITALS — BP 123/76 | HR 73 | Temp 98.0°F | Resp 16 | Ht 65.0 in | Wt 161.2 lb

## 2018-04-18 DIAGNOSIS — R5383 Other fatigue: Secondary | ICD-10-CM | POA: Diagnosis not present

## 2018-04-18 DIAGNOSIS — F32A Depression, unspecified: Secondary | ICD-10-CM

## 2018-04-18 DIAGNOSIS — Z789 Other specified health status: Secondary | ICD-10-CM

## 2018-04-18 DIAGNOSIS — R635 Abnormal weight gain: Secondary | ICD-10-CM

## 2018-04-18 DIAGNOSIS — G9332 Myalgic encephalomyelitis/chronic fatigue syndrome: Secondary | ICD-10-CM

## 2018-04-18 DIAGNOSIS — R5382 Chronic fatigue, unspecified: Secondary | ICD-10-CM | POA: Insufficient documentation

## 2018-04-18 DIAGNOSIS — E663 Overweight: Secondary | ICD-10-CM | POA: Diagnosis not present

## 2018-04-18 DIAGNOSIS — F450 Somatization disorder: Secondary | ICD-10-CM

## 2018-04-18 DIAGNOSIS — F329 Major depressive disorder, single episode, unspecified: Secondary | ICD-10-CM | POA: Diagnosis not present

## 2018-04-18 HISTORY — DX: Chronic fatigue, unspecified: R53.82

## 2018-04-18 HISTORY — DX: Myalgic encephalomyelitis/chronic fatigue syndrome: G93.32

## 2018-04-18 NOTE — Patient Instructions (Signed)
I will call you with lab results once available.  Start b12 daily. Continue vit d  If labs normal- this may be worsening depression. If that ends up being the case--> hopefully once the Effexor kicks in you will see improvement.    Fatigue If you have fatigue, you feel tired all the time and have a lack of energy or a lack of motivation. Fatigue may make it difficult to start or complete tasks because of exhaustion. In general, occasional or mild fatigue is often a normal response to activity or life. However, long-lasting (chronic) or extreme fatigue may be a symptom of a medical condition. Follow these instructions at home: General instructions  Watch your fatigue for any changes.  Go to bed and get up at the same time every day.  Avoid fatigue by pacing yourself during the day and getting enough sleep at night.  Maintain a healthy weight. Medicines  Take over-the-counter and prescription medicines only as told by your health care provider.  Take a multivitamin, if told by your health care provider.  Do not use herbal or dietary supplements unless they are approved by your health care provider. Activity   Exercise regularly, as told by your health care provider.  Use or practice techniques to help you relax, such as yoga, tai chi, meditation, or massage therapy. Eating and drinking   Avoid heavy meals in the evening.  Eat a well-balanced diet, which includes lean proteins, whole grains, plenty of fruits and vegetables, and low-fat dairy products.  Avoid consuming too much caffeine.  Avoid the use of alcohol.  Drink enough fluid to keep your urine pale yellow. Lifestyle  Change situations that cause you stress. Try to keep your work and personal schedule in balance.  Do not use any products that contain nicotine or tobacco, such as cigarettes and e-cigarettes. If you need help quitting, ask your health care provider.  Do not use drugs. Contact a health care  provider if:  Your fatigue does not get better.  You have a fever.  You suddenly lose or gain weight.  You have headaches.  You have trouble falling asleep or sleeping through the night.  You feel angry, guilty, anxious, or sad.  You are unable to have a bowel movement (constipation).  Your skin is dry.  You have swelling in your legs or another part of your body. Get help right away if:  You feel confused.  Your vision is blurry.  You feel faint or you pass out.  You have a severe headache.  You have severe pain in your abdomen, your back, or the area between your waist and hips (pelvis).  You have chest pain, shortness of breath, or an irregular or fast heartbeat.  You are unable to urinate, or you urinate less than normal.  You have abnormal bleeding, such as bleeding from the rectum, vagina, nose, lungs, or nipples.  You vomit blood.  You have thoughts about hurting yourself or others. If you ever feel like you may hurt yourself or others, or have thoughts about taking your own life, get help right away. You can go to your nearest emergency department or call:  Your local emergency services (911 in the [place]  A suicide crisis helpline, such as the [employer] at 204-788-6965. This is open 24 hours a day. Summary  If you have fatigue, you feel tired all the time and have a lack of energy or a lack of motivation.  Fatigue may make  it difficult to start or complete tasks because of exhaustion.  Long-lasting (chronic) or extreme fatigue may be a symptom of a medical condition.  Exercise regularly, as told by your health care provider.  Change situations that cause you stress. Try to keep your work and personal schedule in balance. This information is not intended to replace advice given to you by your health care provider. Make sure you discuss any questions you have with your health care provider. Document Released: 12/03/2006  Document Revised: 10/31/2016 Document Reviewed: 10/31/2016 Elsevier Interactive Patient Education  [employer].

## 2018-04-18 NOTE — Progress Notes (Signed)
Mary [name] , 03/17/1970, 48 y.o., female MRN: [id number] Patient Care Team    Relationship Specialty Notifications Start End  Ma Hillock, DO PCP - General Family Medicine  03/12/17   Salvadore Dom, MD Consulting Physician Obstetrics and Gynecology  06/20/17   Gerda Diss, DO Consulting Physician Sports Medicine  02/21/18     Chief Complaint  Patient presents with  . Fatigue    and weight gain, wants thyroid checked     Subjective: Pt presents for an OV with complaints of weight gain, fatigue of 2 months duration.  Associated symptoms include mental fog, lack of motivation, worsening depression.  Patient was seen last week and we addressed her worsening depression with a change in therapy.  She is tolerating the Effexor taper.  She is continuing the Wellbutrin.  She is wondering if her thyroid is responsible for her complaints.  She has not been able to exercise routinely secondary to working mixed shifts and her most recent upper extremity pain/injuries.  She states her sleep pattern is disrupted partially because of her swing shifts, but also she finds herself wanting to sleep all the time, not motivated.  Depression screen [facility] 2/9 04/08/2018 06/20/2017 03/12/2017 03/12/2017  Decreased Interest 2 2 1  0  Down, Depressed, Hopeless 2 2 0 0  PHQ - 2 Score 4 4 1  0  Altered sleeping 2 2 0 -  Tired, decreased energy 3 2 1  -  Change in appetite 2 2 1  -  Feeling bad or failure about yourself  0 1 0 -  Trouble concentrating 1 0 0 -  Moving slowly or fidgety/restless 0 0 0 -  Suicidal thoughts 0 0 0 -  PHQ-9 Score 12 11 3  -  Difficult doing work/chores Somewhat difficult - Not difficult at all -    Allergies  Allergen Reactions  . Molds & Smuts Shortness Of Breath   Social History   Social History Narrative   Single. Bachelor's degree. Works as a traveling Therapist, sports.   Wears a bicycle helmet.   Vegetarian diet.   Drinks caffeine.   Smoke alarm in the home. Wears her  seatbelt.   Feels safe in her relationships.   Past Medical History:  Diagnosis Date  . Arthritis    Neck, left ankle, clavicle  . Chickenpox 1987  . Chronic constipation   . Costochondritis   . Depression with somatization   . Erythema nodosum   . Fibroid uterus   . Gastritis 2018  . GERD (gastroesophageal reflux disease)   . Hemorrhoid   . History of PCR DNA positive for HSV1    buttocks area  . Psoriasis   . Rosacea   . Stomach ulcer 2007   Past Surgical History:  Procedure Laterality Date  . COLONOSCOPY WITH ESOPHAGOGASTRODUODENOSCOPY (EGD)  2007, 2008   Patient reports colonoscopy was unsuccessful secondary to her not being able to tolerate prep. Was being completed for chronic constipation.  . ESOPHAGOGASTRODUODENOSCOPY  06/28/2016   gastritis; gastric antrum  . SALPINGECTOMY Right 2004   ectopic pregnancy  . TONSILLECTOMY  2004   Family History  Problem Relation Age of Onset  . Gout Mother   . Rheum arthritis Mother   . Osteoarthritis Mother   . Breast cancer Mother   . Depression Mother   . Coronary artery disease Mother   . Hyperlipidemia Mother   . Hypertension Mother   . Kidney disease Mother   . Alcohol abuse Father   . Arthritis  Father   . Hearing loss Father   . Hyperlipidemia Father   . Hypertension Father   . Psoriasis Father   . Arthritis Brother   . Hyperlipidemia Brother   . Hypertension Brother   . Psoriasis Brother   . Breast cancer Maternal Grandmother   . Heart murmur Maternal Grandmother   . Breast cancer Paternal Grandmother   . Psoriasis Paternal Grandmother   . Dementia Paternal Grandmother   . Heart attack Paternal Grandfather   . Stroke Paternal Grandfather   . Coronary artery disease Paternal Grandfather    Allergies as of 04/18/2018      Reactions   Molds & Smuts Shortness Of Breath      Medication List       Accurate as of April 18, 2018 11:59 PM. Always use your most recent med list.        ALREX 0.2 %  Susp Generic drug:  loteprednol 1 drop 2 (two) times daily as needed.   buPROPion 300 MG 24 hr tablet Commonly known as:  WELLBUTRIN XL Take 1 tablet (300 mg total) by mouth daily.   Cholecalciferol 1.25 MG (50000 UT) Tabs Take 1 tablet by mouth once a week.   clobetasol cream 0.05 % Commonly known as:  TEMOVATE Apply 1 application topically 2 (two) times daily.   cyclobenzaprine 5 MG tablet Commonly known as:  FLEXERIL Take 1 tablet (5 mg total) by mouth 3 (three) times daily as needed for muscle spasms.   diclofenac 75 MG EC tablet Commonly known as:  VOLTAREN Take 1 tablet (75 mg total) by mouth 2 (two) times daily.   ibuprofen 200 MG tablet Commonly known as:  ADVIL,MOTRIN Take 400 mg by mouth every 8 (eight) hours as needed.   linaclotide 145 MCG Caps capsule Commonly known as:  LINZESS Take 1 capsule (145 mcg total) by mouth daily before breakfast.   tretinoin 0.05 % cream Commonly known as:  RETIN-A Apply topically at bedtime.   valACYclovir 500 MG tablet Commonly known as:  VALTREX Take 1,000 mg by mouth 2 (two) times daily as needed.   venlafaxine XR 37.5 MG 24 hr capsule Commonly known as:  EFFEXOR-XR 1 tab for 2 weeks, then 2 tabs daily.       All past medical history, surgical history, allergies, family history, immunizations andmedications were updated in the EMR today and reviewed under the history and medication portions of their EMR.     ROS: Negative, with the exception of above mentioned in HPI   Objective:  BP 123/76 (BP Location: Left Arm, Patient Position: Sitting, Cuff Size: Normal)   Pulse 73   Temp 98 F (36.7 C) (Oral)   Resp 16   Ht 5\' 5"  (1.651 m)   Wt 161 lb 4 oz (73.1 kg)   LMP  (LMP Unknown) Comment: spots off and on  SpO2 99%   BMI 26.83 kg/m  Body mass index is 26.83 kg/m. Gen: Afebrile. No acute distress. Nontoxic in appearance, well developed, well nourished.  HENT: AT. [place].MMM Eyes:Pupils Equal Round Reactive to  light, Extraocular movements intact,  Conjunctiva without redness, discharge or icterus. Neck/lymp/endocrine: Supple, no lymphadenopathy, no thyromegaly CV: RRR  Chest: CTAB, no wheeze or crackles. Good air movement, normal resp effort.  Neuro:  Normal gait. PERLA. EOMi. Alert. Oriented x3  Psych: Tearful.  Otherwise normal affect, dress and demeanor. Normal speech. Normal thought content and judgment.  No exam data present No results found. No results found for this  or any previous visit (from the past 24 hour(s)).  Assessment/Plan: Mary [name] is a 48 y.o. female present for OV for  Depression with somatization/Major depressive disorder, remission status unspecified, unspecified whether recurrent/fatigue/mental fog -This likely is multifactorial.  Her depression is being treated both by cognitive behavioral therapy and medications.  Her medications were recently altered for better coverage.  She still is in the tapering doses and has not seen full effect.  She is tolerating the Effexor and Wellbutrin combo. -We will rule out thyroid disorder, B12 deficiency and anemia.  She does have some menstrual cycles now that her Nexplanon is 46-31 years old. -Continue vitamin D supplement, consider adding B12 supplement. - TSH - T4, free - B12 - CBC -Follow-up dependent upon labs.  Overweight (BMI 25.0-29.9)/Weight gain Possibly related to thyroid disorder.  Also could be multifactorial secondary to swing shifts, no routine exercise and diet.   Reviewed expectations re: course of current medical issues.  Discussed self-management of symptoms.  Outlined signs and symptoms indicating need for more acute intervention.  Patient verbalized understanding and all questions were answered.  Patient received an After-Visit Summary.    Orders Placed This Encounter  Procedures  . TSH  . T4, free  . B12  . CBC   > 25 minutes spent with patient, >50% of time spent face to face counseling      Note is dictated utilizing voice recognition software. Although note has been proof read prior to signing, occasional typographical errors still can be missed. If any questions arise, please do not hesitate to call for verification.   electronically signed by:  Howard Pouch, DO  [place]

## 2018-04-19 LAB — CBC
HCT: 40.6 % (ref 35.0–45.0)
Hemoglobin: 14.1 g/dL (ref 11.7–15.5)
MCH: 29.7 pg (ref 27.0–33.0)
MCHC: 34.7 g/dL (ref 32.0–36.0)
MCV: 85.7 fL (ref 80.0–100.0)
MPV: 11.8 fL (ref 7.5–12.5)
PLATELETS: 205 10*3/uL (ref 140–400)
RBC: 4.74 10*6/uL (ref 3.80–5.10)
RDW: 12 % (ref 11.0–15.0)
WBC: 7 10*3/uL (ref 3.8–10.8)

## 2018-04-19 LAB — TSH: TSH: 1.79 mIU/L

## 2018-04-19 LAB — T4, FREE: FREE T4: 1 ng/dL (ref 0.8–1.8)

## 2018-04-19 LAB — VITAMIN B12: VITAMIN B 12: 343 pg/mL (ref 200–1100)

## 2018-04-21 ENCOUNTER — Encounter: Payer: Self-pay | Admitting: Family Medicine

## 2018-04-21 ENCOUNTER — Telehealth: Payer: Self-pay | Admitting: Family Medicine

## 2018-04-21 MED ORDER — VENLAFAXINE HCL ER 75 MG PO CP24: ORAL_CAPSULE | ORAL | 0 refills | Status: DC

## 2018-04-21 NOTE — Telephone Encounter (Signed)
Pt returned call and lab message given to patient with verbal understanding. She would like to know what dosage of vitamin B 12 she should be taking. Would like a call back for clarification.

## 2018-04-21 NOTE — Telephone Encounter (Signed)
LM for patient to call for results.

## 2018-04-21 NOTE — Telephone Encounter (Signed)
Please inform patient the following information: Her labs are normal, with the exception of her b12 is lower end normal at 343. I do recommend she start the b12 OTC supplement. B12 < 400 can cause lack of energy and even nerve discomfort.  Hopefully with the start of b12 and as the Effexor increases her symptoms will ease.   I did refill her effexor at the 75 mg dose tab she was tolerating it- take one a day once she starts new bottle.  F/U 6 weeks- and if needed can taper up again on effexor.

## 2018-04-22 ENCOUNTER — Ambulatory Visit (INDEPENDENT_AMBULATORY_CARE_PROVIDER_SITE_OTHER): Payer: No Typology Code available for payment source | Admitting: Clinical

## 2018-04-22 DIAGNOSIS — F331 Major depressive disorder, recurrent, moderate: Secondary | ICD-10-CM

## 2018-04-22 NOTE — Telephone Encounter (Signed)
These notes have been added to the original telephone encounter and routed back to PCP

## 2018-04-22 NOTE — Telephone Encounter (Signed)
Spoke with patient to let her know dosage information. Stated verbal understanding.

## 2018-04-22 NOTE — Telephone Encounter (Signed)
b12 [id number] mcg

## 2018-04-22 NOTE — Telephone Encounter (Signed)
NOTE BELOW COPIED FROM A DIFFERENT PHONE NOTE THAT WAS CREATED:   Curlene Labrum, RN       04/21/18 5:16 PM  Note    Pt returned call and lab message given to patient with verbal understanding. She would like to know what dosage of vitamin B 12 she should be taking. Would like a call back for clarification

## 2018-04-22 NOTE — Telephone Encounter (Signed)
Routing to PCP to advise if there is a certain dosage of M21 she would like for patient to start.

## 2018-04-23 ENCOUNTER — Ambulatory Visit: Payer: No Typology Code available for payment source | Admitting: Obstetrics & Gynecology

## 2018-04-23 ENCOUNTER — Encounter: Payer: Self-pay | Admitting: Obstetrics & Gynecology

## 2018-04-23 VITALS — BP 112/70

## 2018-04-23 DIAGNOSIS — Z975 Presence of (intrauterine) contraceptive device: Secondary | ICD-10-CM | POA: Diagnosis not present

## 2018-04-23 DIAGNOSIS — N898 Other specified noninflammatory disorders of vagina: Secondary | ICD-10-CM

## 2018-04-23 DIAGNOSIS — N921 Excessive and frequent menstruation with irregular cycle: Secondary | ICD-10-CM | POA: Diagnosis not present

## 2018-04-23 LAB — WET PREP FOR TRICH, YEAST, CLUE

## 2018-04-23 NOTE — Progress Notes (Signed)
    Michalle Rademaker 1970/06/05 [id number]        48 y.o.  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  RP: Vaginal discharge with odor  HPI: Nexplanon x 09/2015, experiencing mild spotting most days now.  No pelvic pain.  C/O vaginal discharge with odor.  No fever.  Abstinent.   OB History  Gravida Para Term Preterm AB Living  1 0 0 0 1 0  SAB TAB Ectopic Multiple Live Births      1        # Outcome Date GA Lbr Len/2nd Weight Sex Delivery Anes PTL Lv  1 Ectopic             Past medical history,surgical history, problem list, medications, allergies, family history and social history were all reviewed and documented in the EPIC chart.   Directed ROS with pertinent positives and negatives documented in the history of present illness/assessment and plan.  Exam:  Vitals:   04/23/18 1450  BP: 112/70   General appearance:  Normal  Abdomen: Normal  Gynecologic exam: Vulva normal.  Speculum:  Cervix/Vagina normal.  Dark blood in vagina.  Wet prep done.  Wet prep negative   Assessment/Plan:  48 y.o. G1P0010   1. Vaginal odor Wet prep negative.  Patient reassured.  Will use probiotic for prevention. - WET PREP FOR [place], YEAST, CLUE  2. Breakthrough bleeding on Nexplanon Nexplanon for 2 years and 7 months with breakthrough bleeding most days.  Decision to proceed with removal/insertion of a new Nexplanon at follow-up visit in the hopes that it will stop the breakthrough bleeding.  Counseling on above issues and coordination of care more than 50% for 15 minutes.  Princess Bruins MD, 3:07 PM 04/23/2018

## 2018-04-23 NOTE — Patient Instructions (Signed)
1. Vaginal odor Wet prep negative.  Patient reassured.  Will use probiotic for prevention. - WET PREP FOR [place], YEAST, CLUE  2. Breakthrough bleeding on Nexplanon Nexplanon for 2 years and 7 months with breakthrough bleeding most days.  Decision to proceed with removal/insertion of a new Nexplanon at follow-up visit in the hopes that it will stop the breakthrough bleeding.  Miciah, it was a pleasure seeing you today!

## 2018-05-05 ENCOUNTER — Encounter: Payer: Self-pay | Admitting: Family Medicine

## 2018-05-05 NOTE — Telephone Encounter (Signed)
Please advise on tapering doses for D/C the Effexor   Thanks

## 2018-05-06 ENCOUNTER — Encounter: Payer: Self-pay | Admitting: Family Medicine

## 2018-05-06 ENCOUNTER — Ambulatory Visit (INDEPENDENT_AMBULATORY_CARE_PROVIDER_SITE_OTHER): Payer: No Typology Code available for payment source | Admitting: Clinical

## 2018-05-06 ENCOUNTER — Other Ambulatory Visit: Payer: Self-pay | Admitting: Family Medicine

## 2018-05-06 DIAGNOSIS — F332 Major depressive disorder, recurrent severe without psychotic features: Secondary | ICD-10-CM | POA: Diagnosis not present

## 2018-05-06 NOTE — Telephone Encounter (Signed)
Sent my chart message explaining how to taper off medication if desired

## 2018-05-07 ENCOUNTER — Other Ambulatory Visit: Payer: Self-pay

## 2018-05-07 ENCOUNTER — Encounter: Payer: Self-pay | Admitting: Family Medicine

## 2018-05-07 ENCOUNTER — Ambulatory Visit (INDEPENDENT_AMBULATORY_CARE_PROVIDER_SITE_OTHER): Payer: No Typology Code available for payment source | Admitting: Family Medicine

## 2018-05-07 VITALS — BP 99/67 | HR 81 | Temp 98.2°F | Resp 16 | Ht 65.0 in | Wt 157.0 lb

## 2018-05-07 DIAGNOSIS — R000 Tachycardia, unspecified: Secondary | ICD-10-CM | POA: Insufficient documentation

## 2018-05-07 DIAGNOSIS — F329 Major depressive disorder, single episode, unspecified: Secondary | ICD-10-CM

## 2018-05-07 DIAGNOSIS — F450 Somatization disorder: Secondary | ICD-10-CM

## 2018-05-07 DIAGNOSIS — R002 Palpitations: Secondary | ICD-10-CM | POA: Diagnosis not present

## 2018-05-07 DIAGNOSIS — F419 Anxiety disorder, unspecified: Secondary | ICD-10-CM

## 2018-05-07 NOTE — Progress Notes (Signed)
Mary [name] , [birth year], 48 y.o., female MRN: [id number] Patient Care Team    Relationship Specialty Notifications Start End  Ma Hillock, DO PCP - General Family Medicine  03/12/17   Salvadore Dom, MD Consulting Physician Obstetrics and Gynecology  06/20/17   Gerda Diss, DO Consulting Physician Sports Medicine  02/21/18     Chief Complaint  Patient presents with  . Tachycardia    Pt states her heart rate has been running in the 130-150's since starting the new medication      Subjective: Pt presents for an OV with complaints of tachycardia 130-150s intermittently over the last few weeks. She states she felt palpitations on occassions as well.  She is wondering if it secondary to the start of Effexor.  She was started on Effexor beginning of February and tapered to 75 mg daily.  She states she also feels an occasional nausea feeling.  Sunday she noticed nausea and a "weird feeling "that also was accompanied by headache.  States she felt a sensation similar to palpitations and she had downloaded an app on her phone to track this and it showed a heart rate between 130/1 50s for approximately 6 minutes.  She states she was laying down in her bed at this time.  She was not feeling any more anxiousness then her normal.  She is happy with the change in medication from Zoloft to Effexor as far as her weight is concerned.  She is still taking the Wellbutrin.  She does not feel like her depression anxiety is anymore controlled on this regimen as in comparison to the Wellbutrin/Zoloft.  Depression screen [facility] 2/9 04/08/2018 06/20/2017 03/12/2017 03/12/2017  Decreased Interest 2 2 1  0  Down, Depressed, Hopeless 2 2 0 0  PHQ - 2 Score 4 4 1  0  Altered sleeping 2 2 0 -  Tired, decreased energy 3 2 1  -  Change in appetite 2 2 1  -  Feeling bad or failure about yourself  0 1 0 -  Trouble concentrating 1 0 0 -  Moving slowly or fidgety/restless 0 0 0 -  Suicidal thoughts 0 0 0 -  PHQ-9  Score 12 11 3  -  Difficult doing work/chores Somewhat difficult - Not difficult at all -    Allergies  Allergen Reactions  . Molds & Smuts Shortness Of Breath  . Effexor [Venlafaxine] Nausea Only   Social History   Social History Narrative   Single. Bachelor's degree. Works as a traveling Therapist, sports.   Wears a bicycle helmet.   Vegetarian diet.   Drinks caffeine.   Smoke alarm in the home. Wears her seatbelt.   Feels safe in her relationships.   Past Medical History:  Diagnosis Date  . Arthritis    Neck, left ankle, clavicle  . Chickenpox 1987  . Chronic constipation   . Costochondritis   . Depression with somatization   . Erythema nodosum   . Fibroid uterus   . Gastritis 2018  . GERD (gastroesophageal reflux disease)   . Hemorrhoid   . History of PCR DNA positive for HSV1    buttocks area  . Psoriasis   . Rosacea   . Stomach ulcer 2007   Past Surgical History:  Procedure Laterality Date  . COLONOSCOPY WITH ESOPHAGOGASTRODUODENOSCOPY (EGD)  2007, 2008   Patient reports colonoscopy was unsuccessful secondary to her not being able to tolerate prep. Was being completed for chronic constipation.  . ESOPHAGOGASTRODUODENOSCOPY  06/28/2016   gastritis; gastric  antrum  . SALPINGECTOMY Right 2004   ectopic pregnancy  . TONSILLECTOMY  2004   Family History  Problem Relation Age of Onset  . Gout Mother   . Rheum arthritis Mother   . Osteoarthritis Mother   . Breast cancer Mother   . Depression Mother   . Coronary artery disease Mother   . Hyperlipidemia Mother   . Hypertension Mother   . Kidney disease Mother   . Alcohol abuse Father   . Arthritis Father   . Hearing loss Father   . Hyperlipidemia Father   . Hypertension Father   . Psoriasis Father   . Arthritis Brother   . Hyperlipidemia Brother   . Hypertension Brother   . Psoriasis Brother   . Breast cancer Maternal Grandmother   . Heart murmur Maternal Grandmother   . Breast cancer Paternal Grandmother   .  Psoriasis Paternal Grandmother   . Dementia Paternal Grandmother   . Heart attack Paternal Grandfather   . Stroke Paternal Grandfather   . Coronary artery disease Paternal Grandfather    Allergies as of 05/07/2018      Reactions   Molds & Smuts Shortness Of Breath   Effexor [venlafaxine] Nausea Only      Medication List       Accurate as of May 07, 2018 11:21 AM. Always use your most recent med list.        Alrex 0.2 % Susp Generic drug:  loteprednol 1 drop 2 (two) times daily as needed.   buPROPion 300 MG 24 hr tablet Commonly known as:  WELLBUTRIN XL Take 1 tablet (300 mg total) by mouth daily.   Cholecalciferol 1.25 MG (50000 UT) Tabs Take 1 tablet by mouth once a week.   clobetasol cream 0.05 % Commonly known as:  TEMOVATE Apply 1 application topically 2 (two) times daily.   cyclobenzaprine 5 MG tablet Commonly known as:  FLEXERIL Take 1 tablet (5 mg total) by mouth 3 (three) times daily as needed for muscle spasms.   diclofenac 75 MG EC tablet Commonly known as:  VOLTAREN Take 1 tablet (75 mg total) by mouth 2 (two) times daily.   ibuprofen 200 MG tablet Commonly known as:  ADVIL,MOTRIN Take 400 mg by mouth every 8 (eight) hours as needed.   linaclotide 145 MCG Caps capsule Commonly known as:  LINZESS Take 1 capsule (145 mcg total) by mouth daily before breakfast.   tretinoin 0.05 % cream Commonly known as:  RETIN-A Apply topically at bedtime.   valACYclovir 500 MG tablet Commonly known as:  VALTREX Take 1,000 mg by mouth 2 (two) times daily as needed.   venlafaxine XR 75 MG 24 hr capsule Commonly known as:  EFFEXOR-XR 1 tab for 2 weeks, then 2 tabs daily.       All past medical history, surgical history, allergies, family history, immunizations andmedications were updated in the EMR today and reviewed under the history and medication portions of their EMR.     ROS: Negative, with the exception of above mentioned in HPI   Objective:  BP  99/67 (BP Location: Right Arm, Patient Position: Sitting, Cuff Size: Normal)   Pulse 81   Temp 98.2 F (36.8 C) (Oral)   Resp 16   Ht 5\' 5"  (1.651 m)   Wt 157 lb (71.2 kg)   LMP  (LMP Unknown) Comment: spots off and on  SpO2 98%   BMI 26.13 kg/m  Body mass index is 26.13 kg/m. Gen: Afebrile. No acute distress.  Nontoxic in appearance, well developed, well nourished.  HENT: AT. [place].MMM, Eyes:Pupils Equal Round Reactive to light, Extraocular movements intact,  Conjunctiva without redness, discharge or icterus. CV: RRR no murmur, no edema Chest: CTAB, no wheeze or crackles. Good air movement, normal resp effort.  Skin: no rashes, purpura or petechiae.  Neuro:  Normal gait. PERLA. EOMi. Alert. Oriented x3  Psych: Anxious, otherwise normal affect, dress and demeanor. Normal speech. Normal thought content and judgment.  No exam data present No results found. No results found for this or any previous visit (from the past 24 hour(s)).  Assessment/Plan: Kamalei Roeder is a 48 y.o. female present for OV for  Depression with somatization/Major depressive disorder, remission status unspecified, unspecified whether recurrent/Anxiety -Continue Wellbutrin. -  continue Effexor 75 mg daily for now.  After lengthy discussion she would like to stay on this for another 2 to 3 weeks to see if side effects resolve.  This also may not be connected to Effexor at all.  Overall she is happy with the results of the combo-does admit it is not working for her depression anxiety any better than the Wellbutrin Zoloft combo. -If she decides to stop Effexor, she is guided on how to taper off medication in her AVS.  She is also been told to call us so that we can provide additional medication for control of her depression and anxiety and then will need to follow-up with her 3 months after starting new medication.  Tachycardia/Palpitations -Discussed options with her today, and she is agreeable to cardiology referral  and evaluation, possible cardiac monitor placement. -Discontinue  caffeine use. Avoid high sugar/chocolate snacks. - Ambulatory referral to Cardiology    Reviewed expectations re: course of current medical issues.  Discussed self-management of symptoms.  Outlined signs and symptoms indicating need for more acute intervention.  Patient verbalized understanding and all questions were answered.  Patient received an After-Visit Summary.    No orders of the defined types were placed in this encounter.   > 25 minutes spent with patient, >50% of time spent face to face counseling    Note is dictated utilizing voice recognition software. Although note has been proof read prior to signing, occasional typographical errors still can be missed. If any questions arise, please do not hesitate to call for verification.   electronically signed by:  Howard Pouch, DO  [place]

## 2018-05-07 NOTE — Patient Instructions (Signed)
I referred you to cardiology to get a monitor and see if you are having episodes of tachycardia.   Stay on 75 mg of effexor for 2-3 weeks, if symptoms resolve -- we can stay on this med and consider increasing. If symptoms do not resolve then start tapering off every other day for 1 week, then every 2 days for 1 week and make sure to call in before tapering off so we can get another med called in for your anxiety.    Palpitations Palpitations are feelings that your heartbeat is not normal. Your heartbeat may feel like it is:  Uneven.  Faster than normal.  Fluttering.  Skipping a beat. This is usually not a serious problem. In some cases, you may need tests to rule out any serious problems. Follow these instructions at home: Pay attention to any changes in your condition. Take these actions to help manage your symptoms: Eating and drinking  Avoid: ? Coffee, tea, soft drinks, and energy drinks. ? Chocolate. ? Alcohol. ? Diet pills. Lifestyle   Try to lower your stress. These things can help you relax: ? Yoga. ? Deep breathing and meditation. ? Exercise. ? Using words and images to create positive thoughts (guided imagery). ? Using your mind to control things in your body (biofeedback).  Do not use drugs.  Get plenty of rest and sleep. Keep a regular bed time. General instructions   Take over-the-counter and prescription medicines only as told by your doctor.  Do not use any products that contain nicotine or tobacco, such as cigarettes and e-cigarettes. If you need help quitting, ask your doctor.  Keep all follow-up visits as told by your doctor. This is important. You may need more tests if palpitations do not go away or get worse. Contact a doctor if:  Your symptoms last more than 24 hours.  Your symptoms occur more often. Get help right away if you:  Have chest pain.  Feel short of breath.  Have a very bad headache.  Feel dizzy.  Pass out (faint).  Summary  Palpitations are feelings that your heartbeat is uneven or faster than normal. It may feel like your heart is fluttering or skipping a beat.  Avoid food and drinks that may cause palpitations. These include caffeine, chocolate, and alcohol.  Try to lower your stress. Do not smoke or use drugs.  Get help right away if you faint or have chest pain, shortness of breath, a severe headache, or dizziness. This information is not intended to replace advice given to you by your health care provider. Make sure you discuss any questions you have with your health care provider. Document Released: 11/15/2007 Document Revised: 03/20/2017 Document Reviewed: 03/20/2017 Elsevier Interactive Patient Education  2019 [employer].

## 2018-05-08 ENCOUNTER — Encounter: Payer: Self-pay | Admitting: Family Medicine

## 2018-05-08 DIAGNOSIS — F419 Anxiety disorder, unspecified: Secondary | ICD-10-CM | POA: Insufficient documentation

## 2018-05-14 MED FILL — VENLAFAXINE HCL ER 75 MG CA: 75 | 52 days supply | Qty: 90 | Fill #0

## 2018-05-20 ENCOUNTER — Ambulatory Visit: Payer: No Typology Code available for payment source | Admitting: Clinical

## 2018-05-20 ENCOUNTER — Ambulatory Visit (INDEPENDENT_AMBULATORY_CARE_PROVIDER_SITE_OTHER): Payer: No Typology Code available for payment source | Admitting: Clinical

## 2018-05-20 DIAGNOSIS — F332 Major depressive disorder, recurrent severe without psychotic features: Secondary | ICD-10-CM | POA: Diagnosis not present

## 2018-05-21 ENCOUNTER — Telehealth: Payer: No Typology Code available for payment source | Admitting: Family

## 2018-05-21 DIAGNOSIS — R6889 Other general symptoms and signs: Secondary | ICD-10-CM

## 2018-05-21 DIAGNOSIS — Z7189 Other specified counseling: Secondary | ICD-10-CM

## 2018-05-21 MED ORDER — BENZONATATE 100 MG PO CAPS: 100.0000 mg | ORAL_CAPSULE | Freq: Three times a day (TID) | ORAL | 0 refills | Status: DC | PRN

## 2018-05-21 MED ORDER — ALBUTEROL SULFATE HFA 108 (90 BASE) MCG/ACT IN AERS: 2.0000 | INHALATION_SPRAY | Freq: Four times a day (QID) | RESPIRATORY_TRACT | 0 refills | Status: DC | PRN

## 2018-05-21 NOTE — Progress Notes (Signed)
E-Visit for Corona Virus Screening  Based on your current symptoms, you may very well have the virus, however your symptoms are mild. Currently, not all patients are being tested. If the symptoms are mild and there is not a known exposure, performing the test is not indicated.  Called patient and discussed her SOB. She does not have active SOB only at times. On the phone I did not hear any SOB or distress.  Discussed red flags that she would need to go to ED. Advised her to self isolate for 14 days.    Coronavirus disease 2019 (COVID-19) is a respiratory illness that can spread from person to person. The virus that causes COVID-19 is a new virus that was first identified in the country of [place] but is now found in multiple other countries and has spread to the [place].  Symptoms associated with the virus are mild to severe fever, cough, and shortness of breath. There is currently no vaccine to protect against COVID-19, and there is no specific antiviral treatment for the virus.   To be considered HIGH RISK for Coronavirus (COVID-19), you have to meet the following criteria:  . Traveled to [place], [place], [place], [place] or [place]; or in the [place] to [place], [place], [place], or [place]; and have fever, cough, and shortness of breath within the last 2 weeks of travel OR  . Been in close contact with a person diagnosed with COVID-19 within the last 2 weeks and have fever, cough, and shortness of breath  . IF YOU DO NOT MEET THESE CRITERIA, YOU ARE CONSIDERED LOW RISK FOR COVID-19.   It is vitally important that if you feel that you have an infection such as this virus or any other virus that you stay home and away from places where you may spread it to others.  You should self-quarantine for 14 days if you have symptoms that could potentially be coronavirus and avoid contact with people age 4 and older.   You can use medication such as A prescription cough medication called  Tessalon Perles 100 mg. You may take 1-2 capsules every 8 hours as needed for cough and A prescription inhaler called Albuterol MDI 90 mcg /actuation 2 puffs every 4 hours as needed for shortness of breath, wheezing, cough.  You may also take acetaminophen (Tylenol) as needed for fever.   Reduce your risk of any infection by using the same precautions used for avoiding the common cold or flu:  Marland Kitchen Wash your hands often with soap and warm water for at least 20 seconds.  If soap and water are not readily available, use an alcohol-based hand sanitizer with at least 60% alcohol.  . If coughing or sneezing, cover your mouth and nose by coughing or sneezing into the elbow areas of your shirt or coat, into a tissue or into your sleeve (not your hands). . Avoid shaking hands with others and consider head nods or verbal greetings only. . Avoid touching your eyes, nose, or mouth with unwashed hands.  . Avoid close contact with people who are sick. . Avoid places or events with large numbers of people in one location, like concerts or sporting events. . Carefully consider travel plans you have or are making. . If you are planning any travel outside or inside the [place], visit the CDC's Travelers' Health webpage for the latest health notices. . If you have some symptoms but not all symptoms, continue to monitor at home and seek  medical attention if your symptoms worsen. . If you are having a medical emergency, call 911.  HOME CARE . Only take medications as instructed by your medical team. . Drink plenty of fluids and get plenty of rest. . A steam or ultrasonic humidifier can help if you have congestion.   GET HELP RIGHT AWAY IF: . You develop worsening fever. . You become short of breath . You cough up blood. . Your symptoms become more severe MAKE SURE YOU   Understand these instructions.  Will watch your condition.  Will get help right away if you are not doing well or get worse.  Your e-visit  answers were reviewed by a board certified advanced clinical practitioner to complete your personal care plan.  Depending on the condition, your plan could have included both over the counter or prescription medications.  If there is a problem please reply once you have received a response from your provider. Your safety is important to [place].  If you have drug allergies check your prescription carefully.    You can use MyChart to ask questions about today's visit, request a non-urgent call back, or ask for a work or school excuse for 24 hours related to this e-Visit. If it has been greater than 24 hours you will need to follow up with your provider, or enter a new e-Visit to address those concerns. You will get an e-mail in the next two days asking about your experience.  I hope that your e-visit has been valuable and will speed your recovery. Thank you for using e-visits.

## 2018-05-21 NOTE — Progress Notes (Signed)
Approximately 5 minutes was spent documenting and reviewing patient's chart.   

## 2018-06-01 ENCOUNTER — Ambulatory Visit (INDEPENDENT_AMBULATORY_CARE_PROVIDER_SITE_OTHER): Payer: No Typology Code available for payment source | Admitting: Clinical

## 2018-06-01 DIAGNOSIS — F332 Major depressive disorder, recurrent severe without psychotic features: Secondary | ICD-10-CM | POA: Diagnosis not present

## 2018-06-03 ENCOUNTER — Ambulatory Visit: Payer: No Typology Code available for payment source | Admitting: Clinical

## 2018-06-13 ENCOUNTER — Ambulatory Visit: Payer: Self-pay | Admitting: Clinical

## 2018-06-17 ENCOUNTER — Telehealth: Payer: Self-pay | Admitting: Family Medicine

## 2018-06-17 NOTE — Telephone Encounter (Signed)
SW patient regarding symptoms.  Advised per PCP, she needs to be assessed urgently at a location with labs/imaging. Patient verbalized understanding and agrees to go to Urgent Care.

## 2018-06-17 NOTE — Telephone Encounter (Signed)
Pt was called and stated Constant dizziness for the past 24 hours with nausea. Lightheaded with Slight L eye vision change. No appetite but when she does eat the vertigo is worse. Pt had referral last month to Cardiology but did not call back and then stated they were not seeing patients due to COVID-19. Pt has states she is wondering if it is the Wellbutrin and Effexor together but has been on this medication since March. Denies fever or respiratory symptoms.   Please advise

## 2018-06-17 NOTE — Telephone Encounter (Signed)
please return her call as soon as possible. -With her symptoms of nausea, lightheaded and unilateral visual changes she should be seen urgently at a location that can offer her labs and possible imaging study.  We cannot do that for her here this afternoon. -her Options are to be seen urgently/emergently in a urgent care or the ED. -We could set up a virtual visit at 340 today so that she and I can discuss in more detail, but I suspect she is going to need to have a more in-depth evaluation and for her safety believe she should be seen urgently/emergently. -If she elects not to go to the emergency room we can offer her an in person visit tomorrow morning, when we have at least lab availability.  It is her decision, however I strongly encouraged her to be seen emergently for her symptoms since they require immediate lab results and possibly imaging.

## 2018-06-17 NOTE — Telephone Encounter (Signed)
Pt sent my chart message: Patient would like to schedule an in person visit. She is having dizzy spells. She is not sure if it is coming from her blood pressure or maybe her "sugars".   Please advise if she can come for in office visit

## 2018-06-17 NOTE — Telephone Encounter (Signed)
Patient would like to schedule an in person visit. She is having dizzy spells. She is not sure if it is coming from her blood pressure or maybe her "sugars". Please advise.

## 2018-06-19 ENCOUNTER — Encounter: Payer: Self-pay | Admitting: *Deleted

## 2018-06-19 ENCOUNTER — Telehealth: Payer: Self-pay | Admitting: *Deleted

## 2018-06-19 ENCOUNTER — Telehealth: Payer: Self-pay | Admitting: Cardiovascular Disease

## 2018-06-19 NOTE — Telephone Encounter (Signed)
Virtual Visit Pre-Appointment Phone Call  "(Name), I am calling you today to discuss your upcoming appointment. We are currently trying to limit exposure to the virus that causes COVID-19 by seeing patients at home rather than in the office."  1. "What is the BEST phone number to call the day of the visit?" - include this in appointment notes  2. Do you have or have access to (through a family member/friend) a smartphone with video capability that we can use for your visit?" a. If yes - list this number in appt notes as cell (if different from BEST phone #) and list the appointment type as a VIDEO visit in appointment notes b. If no - list the appointment type as a PHONE visit in appointment notes  3. Confirm consent - "In the setting of the current Covid19 crisis, you are scheduled for a (phone or video) visit with your provider on (date) at (time).  Just as we do with many in-office visits, in order for you to participate in this visit, we must obtain consent.  If you'd like, I can send this to your mychart (if signed up) or email for you to review.  Otherwise, I can obtain your verbal consent now.  All virtual visits are billed to your insurance company just like a normal visit would be.  By agreeing to a virtual visit, we'd like you to understand that the technology does not allow for your provider to perform an examination, and thus may limit your provider's ability to fully assess your condition. If your provider identifies any concerns that need to be evaluated in person, we will make arrangements to do so.  Finally, though the technology is pretty good, we cannot assure that it will always work on either your or our end, and in the setting of a video visit, we may have to convert it to a phone-only visit.  In either situation, we cannot ensure that we have a secure connection.  Are you willing to proceed?" STAFF: Did the patient verbally acknowledge consent to telehealth visit? Document  YES/NO here: YES  4. Advise patient to be prepared - "Two hours prior to your appointment, go ahead and check your blood pressure, pulse, oxygen saturation, and your weight (if you have the equipment to check those) and write them all down. When your visit starts, your provider will ask you for this information. If you have an Apple Watch or Kardia device, please plan to have heart rate information ready on the day of your appointment. Please have a pen and paper handy nearby the day of the visit as well."  5. Give patient instructions for MyChart download to smartphone OR Mary [name]/Mary [name] as below if video visit (depending on what platform provider is using)  6. Inform patient they will receive a phone call 15 minutes prior to their appointment time (may be from unknown caller ID) so they should be prepared to answer    TELEPHONE CALL NOTE  Mary [name] has been deemed a candidate for a follow-up tele-health visit to limit community exposure during the Covid-19 pandemic. I spoke with the patient via phone to ensure availability of phone/video source, confirm preferred email & phone number, and discuss instructions and expectations.  I reminded Mary [name] to be prepared with any vital sign and/or heart rhythm information that could potentially be obtained via home monitoring, at the time of her visit. I reminded Mary [name] to expect a phone call prior to her visit.  Mary [name], Mary [name] 06/19/2018 10:21 AM   INSTRUCTIONS FOR DOWNLOADING THE MYCHART APP TO SMARTPHONE  - The patient must first make sure to have activated MyChart and know their login information - If Apple, go to [employer] and type in MyChart in the search bar and download the app. If Android, ask patient to go to [employer] and type in [place] in the search bar and download the app. The app is free but as with any other app downloads, their phone may require them to verify saved payment information or  Apple/Android password.  - The patient will need to then log into the app with their MyChart username and password, and select [facility] as their healthcare provider to link the account. When it is time for your visit, go to the MyChart app, find appointments, and click Begin Video Visit. Be sure to Select Allow for your device to access the Microphone and Camera for your visit. You will then be connected, and your provider will be with you shortly.  **If they have any issues connecting, or need assistance please contact MyChart service desk [phone])**  **If using a computer, in order to ensure the best quality for their visit they will need to use either of the following Internet Browsers: [employer], or Google Chrome**  IF USING Mary [name] or Mary [name] - The patient will receive a link just prior to their visit by text.     FULL LENGTH CONSENT FOR TELE-HEALTH VISIT   I hereby voluntarily request, consent and authorize [facility] and its employed or contracted physicians, physician assistants, nurse practitioners or other licensed health care professionals (the Practitioner), to provide me with telemedicine health care services (the Services") as deemed necessary by the treating Practitioner. I acknowledge and consent to receive the Services by the Practitioner via telemedicine. I understand that the telemedicine visit will involve communicating with the Practitioner through live audiovisual communication technology and the disclosure of certain medical information by electronic transmission. I acknowledge that I have been given the opportunity to request an in-person assessment or other available alternative prior to the telemedicine visit and am voluntarily participating in the telemedicine visit.  I understand that I have the right to withhold or withdraw my consent to the use of telemedicine in the course of my care at any time, without affecting my right to future care  or treatment, and that the Practitioner or I may terminate the telemedicine visit at any time. I understand that I have the right to inspect all information obtained and/or recorded in the course of the telemedicine visit and may receive copies of available information for a reasonable fee.  I understand that some of the potential risks of receiving the Services via telemedicine include:   Delay or interruption in medical evaluation due to technological equipment failure or disruption;  Information transmitted may not be sufficient (e.g. poor resolution of images) to allow for appropriate medical decision making by the Practitioner; and/or   In rare instances, security protocols could fail, causing a breach of personal health information.  Furthermore, I acknowledge that it is my responsibility to provide information about my medical history, conditions and care that is complete and accurate to the best of my ability. I acknowledge that Practitioner's advice, recommendations, and/or decision may be based on factors not within their control, such as incomplete or inaccurate data provided by me or distortions of diagnostic images or specimens that may result from electronic transmissions. I understand that  the practice of medicine is not an exact science and that Practitioner makes no warranties or guarantees regarding treatment outcomes. I acknowledge that I will receive a copy of this consent concurrently upon execution via email to the email address I last provided but may also request a printed copy by calling the office of [facility].    I understand that my insurance will be billed for this visit.   I have read or had this consent read to me.  I understand the contents of this consent, which adequately explains the benefits and risks of the Services being provided via telemedicine.   I have been provided ample opportunity to ask questions regarding this consent and the Services and have had  my questions answered to my satisfaction.  I give my informed consent for the services to be provided through the use of telemedicine in my medical care  By participating in this telemedicine visit I agree to the above.       Cardiac Questionnaire:    Since your last visit or hospitalization:    1. Have you been having new or worsening chest pain? YES   2. Have you been having new or worsening shortness of breath? NO 3. Have you been having new or worsening leg swelling, wt gain, or increase in abdominal girth (pants fitting more tightly)? NO   4. Have you had any passing out spells? NO    *A YES to any of these questions would result in the appointment being kept. *If all the answers to these questions are NO, we should indicate that given the current situation regarding the worldwide coronarvirus pandemic, at the recommendation of the CDC, we are looking to limit gatherings in our waiting area, and thus will reschedule their appointment beyond four weeks from today.   _____________   COVID-19 Pre-Screening Questions:   Do you currently have a fever? NO  Have you recently travelled on a cruise, internationally, or to [place], [place], [place], [place], [place], or [place], [place] [employer]) ? NO  Have you been in contact with someone that is currently pending confirmation of Covid19 testing or has been confirmed to have the [place] virus?  YES  Are you currently experiencing fatigue or cough? NO

## 2018-06-19 NOTE — Telephone Encounter (Signed)

## 2018-06-19 NOTE — Telephone Encounter (Signed)
Called x3 for pre reg/LVM °

## 2018-06-20 ENCOUNTER — Telehealth (INDEPENDENT_AMBULATORY_CARE_PROVIDER_SITE_OTHER): Payer: No Typology Code available for payment source | Admitting: Cardiovascular Disease

## 2018-06-20 ENCOUNTER — Telehealth: Payer: Self-pay | Admitting: Radiology

## 2018-06-20 ENCOUNTER — Telehealth: Payer: Self-pay

## 2018-06-20 VITALS — BP 116/68 | HR 72

## 2018-06-20 DIAGNOSIS — R002 Palpitations: Secondary | ICD-10-CM | POA: Diagnosis not present

## 2018-06-20 NOTE — Telephone Encounter (Signed)
Patient and/or DPR-approved person aware of AVS instructions and verbalized understanding. After Visit Summary released to MyChart and pt aware

## 2018-06-20 NOTE — Patient Instructions (Signed)
Medication Instructions:  Your physician recommends that you continue on your current medications as directed. Please refer to the Current Medication list given to you today.  If you need a refill on your cardiac medications before your next appointment, please call your pharmacy.   Lab work: NONE If you have labs (blood work) drawn today and your tests are completely normal, you will receive your results only by: Marland Kitchen MyChart Message (if you have MyChart) OR . A paper copy in the mail If you have any lab test that is abnormal or we need to change your treatment, we will call you to review the results.  Testing/Procedures: Your physician has recommended that you wear a 14 DAY ZIO-PATCH monitor. The Zio patch cardiac monitor continuously records heart rhythm data for up to 14 days, this is for patients being evaluated for multiple types heart rhythms. For the first 24 hours post application, please avoid getting the Zio monitor wet in the shower or by excessive sweating during exercise. After that, feel free to carry on with regular activities. Keep soaps and lotions away from the ZIO XT Patch.  This will be placed at our [facility] location - [address].         Follow-Up: At [facility], you and your health needs are our priority.  As part of our continuing mission to provide you with exceptional heart care, we have created designated Provider Care Teams.  These Care Teams include your primary Cardiologist (physician) and Advanced Practice Providers (APPs -  Physician Assistants and Nurse Practitioners) who all work together to provide you with the care you need, when you need it. You will need a follow up appointment in 6-8 weeks with Dr. Gwenlyn Found.

## 2018-06-20 NOTE — Telephone Encounter (Signed)
Enrolled patient for a 14 day Zio long Term monitor to be mailed due to covid-19. Brief instructions were gone over with the patient and she knows to expect the monitor to arrive in 3-4 days.

## 2018-06-20 NOTE — Progress Notes (Signed)
Virtual Visit via Video Note   This visit type was conducted due to national recommendations for restrictions regarding the COVID-19 Pandemic (e.g. social distancing) in an effort to limit this patient's exposure and mitigate transmission in our community.  Due to her co-morbid illnesses, this patient is at least at moderate risk for complications without adequate follow up.  This format is felt to be most appropriate for this patient at this time.  All issues noted in this document were discussed and addressed.  A limited physical exam was performed with this format.  Please refer to the patient's chart for her consent to telehealth for [facility].   Date:  06/20/2018   ID:  Mary [name], DOB May 08, 1970, MRN [id number]  Patient Location: Home Provider Location: Home   The visit was initially video but because of technical issues had to be changed to phone  PCP:  Ma Hillock, DO  Cardiologist: Dr. 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  She was referred by Dr. Raoul Pitch for cardiovascular evaluation because of recent onset of tachypalpitations and dizziness.  She has no cardiac risk factors.  There is no family history of heart disease.  She is never had a heart attack or stroke.  She does have anxiety and depression currently on Wellbutrin although she was on Effexor and Zoloft in the past as well.  She does drink diet soda and coffee with 2 espresso shots.  Over the last 6 to 8 weeks she is noticed new onset tachypalpitations along with nausea and dizziness.  She has a app on her phone that assesses her heart rate and she says is been up to as high as 160 to 180 bpm.  These episodes occur several times a day  and can last for up to 40 minutes at a time.  The dizziness and nausea are not necessarily correlated with the episodes of tachypalpitations.  Her recent TSH was normal.  The patient does not have symptoms concerning for COVID-19 infection (fever, chills, cough, or new shortness of breath).    Past Medical History:  Diagnosis Date  . Arthritis    Neck, left ankle, clavicle  . Chickenpox 1987  . Chronic constipation   . Costochondritis   . Depression with somatization   . Erythema nodosum   . Fibroid uterus   . Gastritis 2018  . GERD (gastroesophageal reflux disease)   . Hemorrhoid   . History of PCR DNA positive for HSV1    buttocks area  . Psoriasis   . Rosacea   . Stomach ulcer 2007   Past Surgical History:  Procedure Laterality Date  . COLONOSCOPY WITH ESOPHAGOGASTRODUODENOSCOPY (EGD)  2007, 2008   Patient reports colonoscopy was unsuccessful secondary to her not being able to tolerate prep. Was being completed for chronic constipation.  . ESOPHAGOGASTRODUODENOSCOPY  06/28/2016   gastritis; gastric antrum  . SALPINGECTOMY Right 2004   ectopic pregnancy  . TONSILLECTOMY  2004     No outpatient medications have been marked as taking for the 06/20/18 encounter (Telemedicine) with Lorretta Harp, MD.     Allergies:   Molds & smuts and Effexor [venlafaxine]   Social History   Tobacco Use  . Smoking status: Never Smoker  .  Smokeless tobacco: Never Used  Substance Use Topics  . Alcohol use: No    Frequency: Never  . Drug use: Yes     Family Hx: The patient's family history includes Alcohol abuse in her father; Arthritis in her brother and father; Breast cancer in her maternal grandmother, mother, and paternal grandmother; Coronary artery disease in her mother and paternal grandfather; Dementia in her paternal grandmother; Depression in her mother; Gout in her mother; Hearing loss in her father; Heart attack in her paternal grandfather; Heart murmur in her maternal  grandmother; Hyperlipidemia in her brother, father, and mother; Hypertension in her brother, father, and mother; Kidney disease in her mother; Osteoarthritis in her mother; Psoriasis in her brother, father, and paternal grandmother; Rheum arthritis in her mother; Stroke in her paternal grandfather.  ROS:   Please see the history of present illness.     All other systems reviewed and are negative.   Prior CV studies:   The following studies were reviewed today:  None  Labs/Other Tests and Data Reviewed:    EKG:  No ECG reviewed.  Recent Labs: 04/18/2018: Hemoglobin 14.1; Platelets 205; TSH 1.79   Recent Lipid Panel Lab Results  Component Value Date/Time   CHOL 175 06/20/2017 08:52 AM   TRIG 46.0 06/20/2017 08:52 AM   HDL 46.80 06/20/2017 08:52 AM   CHOLHDL 4 06/20/2017 08:52 AM   LDLCALC 119 (H) 06/20/2017 08:52 AM    Wt Readings from Last 3 Encounters:  05/07/18 157 lb (71.2 kg)  04/18/18 161 lb 4 oz (73.1 kg)  04/08/18 165 lb 6 oz (75 kg)     Objective:    Vital Signs:  BP 116/68   Pulse 72    VITAL SIGNS:  reviewed A complete physical exam was not performed since this was a telemedicine virtual telephone visit.  ASSESSMENT & PLAN:    1. Tachypalpitations- new onset tachypalpitations over the last 6 to 8 weeks.  She does admit to diet soda intake as well as coffee with 2 shots of espresso.  When she saw her PCP on 05/07/2018 she was told to limit or discontinue this.  The episodes occur several times a day can last up to 40 minutes at a time.  She also complains of constant dizziness and occasional nausea not necessarily associated with the tachypalpitations.  She has an app on her phone which has documented heart rates up to 1 60-1 80.  Recent TSH was normal.  She is on Wellbutrin.  I am going to get a two-week ZIO patch to further evaluate.  COVID-19 Education: The signs and symptoms of COVID-19 were discussed with the patient and how to seek care for testing (follow  up with PCP or arrange E-visit).  The importance of social distancing was discussed today.  Time:   Today, I have spent 15 minutes with the patient with telehealth technology discussing the above problems.     Medication Adjustments/Labs and Tests Ordered: Current medicines are reviewed at length with the patient today.  Concerns regarding medicines are outlined above.   Tests Ordered: 2-week ZIO patch  Medication Changes: No orders of the defined types were placed in this encounter.   Disposition:  Follow up in 2 month(s)  Signed, Quay Burow, MD  06/20/2018 10:46 AM    [place]

## 2018-06-21 ENCOUNTER — Other Ambulatory Visit: Payer: Self-pay

## 2018-06-21 ENCOUNTER — Encounter (HOSPITAL_COMMUNITY): Payer: Self-pay

## 2018-06-21 ENCOUNTER — Emergency Department (HOSPITAL_COMMUNITY)
Admission: EM | Admit: 2018-06-21 | Discharge: 2018-06-21 | Disposition: A | Discharge status: Home / Self Care | Payer: No Typology Code available for payment source | Attending: Emergency Medicine | Admitting: Emergency Medicine

## 2018-06-21 DIAGNOSIS — Z79899 Other long term (current) drug therapy: Secondary | ICD-10-CM | POA: Diagnosis not present

## 2018-06-21 DIAGNOSIS — R5383 Other fatigue: Secondary | ICD-10-CM | POA: Insufficient documentation

## 2018-06-21 DIAGNOSIS — R42 Dizziness and giddiness: Secondary | ICD-10-CM | POA: Diagnosis present

## 2018-06-21 DIAGNOSIS — R5381 Other malaise: Secondary | ICD-10-CM | POA: Insufficient documentation

## 2018-06-21 LAB — I-STAT BETA HCG BLOOD, ED (MC, WL, AP ONLY): I-stat hCG, quantitative: <5 m[IU]/mL (ref <5)

## 2018-06-21 LAB — CBC WITH DIFFERENTIAL/PLATELET
Abs Immature Granulocytes: 0.04 10*3/uL (ref 0.00–0.07)
Basophils Absolute: 0.1 10*3/uL (ref 0.0–0.1)
Basophils Relative: 1 %
Eosinophils Absolute: 0.2 10*3/uL (ref 0.0–0.5)
Eosinophils Relative: 3 %
HCT: 42.1 % (ref 36.0–46.0)
Hemoglobin: 14 g/dL (ref 12.0–15.0)
Immature Granulocytes: 1 %
Lymphocytes Relative: 23 %
Lymphs Abs: 1.6 10*3/uL (ref 0.7–4.0)
MCH: 30.2 pg (ref 26.0–34.0)
MCHC: 33.3 g/dL (ref 30.0–36.0)
MCV: 90.9 fL (ref 80.0–100.0)
Monocytes Absolute: 0.5 10*3/uL (ref 0.1–1.0)
Monocytes Relative: 7 %
Neutro Abs: 4.5 10*3/uL (ref 1.7–7.7)
Neutrophils Relative %: 65 %
Platelets: 202 10*3/uL (ref 150–400)
RBC: 4.63 MIL/uL (ref 3.87–5.11)
RDW: 12.3 % (ref 11.5–15.5)
WBC: 7 10*3/uL (ref 4.0–10.5)
nRBC: 0 % (ref 0.0–0.2)

## 2018-06-21 LAB — COMPREHENSIVE METABOLIC PANEL
ALT: 14 U/L (ref 0–44)
AST: 16 U/L (ref 15–41)
Albumin: 4.1 g/dL (ref 3.5–5.0)
Alkaline Phosphatase: 44 U/L (ref 38–126)
Anion gap: 6 (ref 5–15)
BUN: 14 mg/dL (ref 6–20)
CO2: 25 mmol/L (ref 22–32)
Calcium: 9.3 mg/dL (ref 8.9–10.3)
Chloride: 108 mmol/L (ref 98–111)
Creatinine, Ser: 0.88 mg/dL (ref 0.44–1.00)
GFR calc Af Amer: >60 mL/min (ref >60)
GFR calc non Af Amer: >60 mL/min (ref >60)
Glucose, Bld: 79 mg/dL (ref 70–99)
Potassium: 4 mmol/L (ref 3.5–5.1)
Sodium: 139 mmol/L (ref 135–145)
Total Bilirubin: 0.8 mg/dL (ref 0.3–1.2)
Total Protein: 6.7 g/dL (ref 6.5–8.1)

## 2018-06-21 LAB — D-DIMER, QUANTITATIVE: D-Dimer, Quant: <0.27 ug/mL-FEU (ref 0.00–0.50)

## 2018-06-21 LAB — TROPONIN I: Troponin I: <0.03 ng/mL (ref <0.03)

## 2018-06-21 NOTE — Discharge Instructions (Addendum)
The cause of your symptoms was not identified today.  Please follow up with your family doctor for recheck.  Get rechecked if you have any new or concerning symptoms.

## 2018-06-21 NOTE — ED Triage Notes (Signed)
States dizziness for a couple of months with headache and vision changes and had e-visit with cardiologist yesterday and wants to apply halter monitor.

## 2018-06-21 NOTE — ED Provider Notes (Signed)
[facility] DEPT Provider Note   CSN: [id number] Arrival date & time: 06/21/18  1948    History   Chief Complaint Chief Complaint  Patient presents with  . Dizziness    HPI Mary [name] is a 48 y.o. female.     The history is provided by the patient and medical records. No language interpreter was used.  Dizziness   Mary [name] is a 48 y.o. female who presents to the Emergency Department complaining of dizziness. She presents for evaluation of six days of dizziness. She feels like she is unsteady and off balance. Symptoms are worse when she turns her head or changes position from sitting to standing. She also complains of fatigue and dyspnea. She has associated body aches. She denies any fevers, cough, chest pain. She has also experienced intermittent palpitations over the last several weeks to months and has been evaluated by cardiology for this. She is scheduled to have Holter monitor next week. She has a history of erythema noticed him and has recurrent lesions on her right leg. She also has a history of depression is compliant with her medications. She works as a Marine scientist. Symptoms are moderate to severe, constant, worsening. Denies tobacco or drug use. Occasional alcohol use. Past Medical History:  Diagnosis Date  . Arthritis    Neck, left ankle, clavicle  . Chickenpox 1987  . Chronic constipation   . Costochondritis   . Depression with somatization   . Erythema nodosum   . Fibroid uterus   . Gastritis 2018  . GERD (gastroesophageal reflux disease)   . Hemorrhoid   . History of PCR DNA positive for HSV1    buttocks area  . Psoriasis   . Rosacea   . Stomach ulcer 2007    Patient Active Problem List   Diagnosis Date Noted  . Anxiety 05/08/2018  . Palpitations 05/07/2018  . Tachycardia 05/07/2018  . Other fatigue 04/18/2018  . Major depressive disorder 04/08/2018  . Irritable bowel syndrome with constipation 11/19/2017  . De  Quervain's tenosynovitis, right 07/03/2017  . Overweight (BMI 25.0-29.9) 06/20/2017  . Multiple pigmented nevi 06/20/2017  . Distal radius fracture, right 05/16/2017  . Tenosynovitis 05/01/2017  . Nexplanon in place 03/14/2017  . Depression with somatization   . Rosacea 03/12/2017  . Vegetarian diet 03/12/2017  . Chronic constipation 03/12/2017  . Psoriasis 03/12/2017    Past Surgical History:  Procedure Laterality Date  . COLONOSCOPY WITH ESOPHAGOGASTRODUODENOSCOPY (EGD)  2007, 2008   Patient reports colonoscopy was unsuccessful secondary to her not being able to tolerate prep. Was being completed for chronic constipation.  . ESOPHAGOGASTRODUODENOSCOPY  06/28/2016   gastritis; gastric antrum  . SALPINGECTOMY Right 2004   ectopic pregnancy  . TONSILLECTOMY  2004     OB History    Gravida  1   Para  0   Term  0   Preterm  0   AB  1   Living  0     SAB      TAB      Ectopic  1   Multiple      Live Births               Home Medications    Prior to Admission medications   Medication Sig Start Date End Date Taking? Authorizing Provider  benzonatate (TESSALON PERLES) 100 MG capsule Take 1 capsule (100 mg total) by mouth 3 (three) times daily as needed. 05/21/18  Yes Sharion Balloon, FNP  albuterol (PROVENTIL HFA;VENTOLIN HFA) 108 (90 Base) MCG/ACT inhaler Inhale 2 puffs into the lungs every 6 (six) hours as needed for wheezing or shortness of breath. 05/21/18   Evelina Dun A, FNP  buPROPion (WELLBUTRIN XL) 300 MG 24 hr tablet Take 1 tablet (300 mg total) by mouth daily. 04/08/18   Kuneff, Renee A, DO  Cholecalciferol 1.25 MG (50000 UT) TABS Take 1 tablet by mouth once a week. 02/25/18   Gerda Diss, DO  clobetasol cream (TEMOVATE) 2.54 % Apply 1 application topically 2 (two) times daily.    [provider]  cyclobenzaprine (FLEXERIL) 5 MG tablet Take 1 tablet (5 mg total) by mouth 3 (three) times daily as needed for muscle spasms. 02/21/18   Gerda Diss, DO  diclofenac (VOLTAREN) 75 MG EC tablet Take 1 tablet (75 mg total) by mouth 2 (two) times daily. 01/31/18   Kuneff, Renee A, DO  ibuprofen (ADVIL,MOTRIN) 200 MG tablet Take 400 mg by mouth every 8 (eight) hours as needed.    [provider]  linaclotide Rolan Lipa) 145 MCG CAPS capsule Take 1 capsule (145 mcg total) by mouth daily before breakfast. 12/16/17   Kuneff, Renee A, DO  loteprednol (ALREX) 0.2 % SUSP 1 drop 2 (two) times daily as needed.    [provider]  tretinoin (RETIN-A) 0.05 % cream Apply topically at bedtime.    [provider]  valACYclovir (VALTREX) 500 MG tablet Take 1,000 mg by mouth 2 (two) times daily as needed.    [provider]  venlafaxine XR (EFFEXOR-XR) 75 MG 24 hr capsule 1 tab for 2 weeks, then 2 tabs daily. 04/21/18   Ma Hillock, DO    Family History Family History  Problem Relation Age of Onset  . Gout Mother   . Rheum arthritis Mother   . Osteoarthritis Mother   . Breast cancer Mother   . Depression Mother   . Coronary artery disease Mother   . Hyperlipidemia Mother   . Hypertension Mother   . Kidney disease Mother   . Alcohol abuse Father   . Arthritis Father   . Hearing loss Father   . Hyperlipidemia Father   . Hypertension Father   . Psoriasis Father   . Arthritis Brother   . Hyperlipidemia Brother   . Hypertension Brother   . Psoriasis Brother   . Breast cancer Maternal Grandmother   . Heart murmur Maternal Grandmother   . Breast cancer Paternal Grandmother   . Psoriasis Paternal Grandmother   . Dementia Paternal Grandmother   . Heart attack Paternal Grandfather   . Stroke Paternal Grandfather   . Coronary artery disease Paternal Grandfather     Social History Social History   Tobacco Use  . Smoking status: Never Smoker  . Smokeless tobacco: Never Used  Substance Use Topics  . Alcohol use: No    Frequency: Never  . Drug use: Yes     Allergies   Molds & smuts and Effexor  [venlafaxine]   Review of Systems Review of Systems  Neurological: Positive for dizziness.  All other systems reviewed and are negative.    Physical Exam Updated Vital Signs BP 118/79   Pulse 76   Temp 98 F (36.7 C) (Oral)   Resp 17   Ht 5\' 5"  (1.651 m)   Wt 70.3 kg   SpO2 100%   BMI 25.79 kg/m   Physical Exam Vitals signs and nursing note reviewed.  Constitutional:  Appearance: She is well-developed.  HENT:     Head: Normocephalic and atraumatic.     Right Ear: Tympanic membrane normal.     Left Ear: Tympanic membrane normal.  Eyes:     Extraocular Movements: Extraocular movements intact.     Pupils: Pupils are equal, round, and reactive to light.  Cardiovascular:     Rate and Rhythm: Normal rate and regular rhythm.     Heart sounds: No murmur.  Pulmonary:     Effort: Pulmonary effort is normal. No respiratory distress.     Breath sounds: Normal breath sounds.  Abdominal:     Palpations: Abdomen is soft.     Tenderness: There is no abdominal tenderness. There is no guarding or rebound.  Musculoskeletal:        General: No tenderness.  Skin:    General: Skin is warm and dry.  Neurological:     General: No focal deficit present.     Mental Status: She is alert and oriented to person, place, and time.     Cranial Nerves: No cranial nerve deficit.     Sensory: No sensory deficit.     Coordination: Coordination normal.     Comments: Five out of five strength in all four extremities with sensation to light touch intact in all four extremities. No pronator drift. Visual fields are grossly intact. No ataxia on finger to nose bilaterally.  Psychiatric:        Behavior: Behavior normal.      ED Treatments / Results  Labs (all labs ordered are listed, but only abnormal results are displayed) Labs Reviewed  COMPREHENSIVE METABOLIC PANEL  CBC WITH DIFFERENTIAL/PLATELET  TROPONIN I  D-DIMER, QUANTITATIVE (NOT AT [facility])  TSH  T4, FREE  I-STAT BETA HCG  BLOOD, ED ([facility], [facility], [facility] ONLY)    EKG EKG Interpretation  Date/Time:  Saturday Jun 21 2018 20:04:27 EDT Ventricular Rate:  84 PR Interval:    QRS Duration: 80 QT Interval:  377 QTC Calculation: 446 R Axis:   50 Text Interpretation:  Sinus rhythm Borderline T abnormalities, inferior leads Confirmed by Quintella Reichert [phone]) on 06/21/2018 8:23:11 PM   Radiology No results found.  Procedures Procedures (including critical care time)  Medications Ordered in ED Medications - No data to display   Initial Impression / Assessment and Plan / ED Course  I have reviewed the triage vital signs and the nursing notes.  Pertinent labs & imaging results that were available during my care of the patient were reviewed by me and considered in my medical decision making (see chart for details).       Patient here for evaluation of dizziness, malaise and fatigue. She is non-toxic appearing on evaluation. Labs without acute abnormalities. EKG with nonspecific changes, similar when compared to prior. Discussed with patient unclear source of symptoms. Question if there is some degree of depression. Discussed home care.  Plan to discharge home with outpatient follow-up and return precautions.  Presentation is not consistent with PE, CHF, ACS, CVA. Final Clinical Impressions(s) / ED Diagnoses   Final diagnoses:  Dizziness  Malaise and fatigue    ED Discharge Orders    None       Quintella Reichert, MD 06/21/18 2240

## 2018-06-27 ENCOUNTER — Ambulatory Visit: Payer: Self-pay | Admitting: Clinical

## 2018-06-30 ENCOUNTER — Telehealth: Payer: Self-pay | Admitting: Cardiovascular Disease

## 2018-06-30 ENCOUNTER — Ambulatory Visit (INDEPENDENT_AMBULATORY_CARE_PROVIDER_SITE_OTHER): Payer: No Typology Code available for payment source

## 2018-06-30 DIAGNOSIS — R002 Palpitations: Secondary | ICD-10-CM | POA: Diagnosis not present

## 2018-06-30 NOTE — Telephone Encounter (Signed)
Returned call to patient.She stated she was not able to work this past weekend due to being so dizzy. Stated she feels like heart beat is irregular but is not fast.She has not put monitor on yet She did not feel like putting monitor on this weekend.Stated she will put monitor on today.

## 2018-06-30 NOTE — Telephone Encounter (Signed)
New Message:    Patient calling she was dizzy this weekend and would like for some to call she had to call out of work. Please call patient.

## 2018-07-01 ENCOUNTER — Ambulatory Visit: Payer: No Typology Code available for payment source | Admitting: Clinical

## 2018-07-08 ENCOUNTER — Telehealth: Payer: Self-pay | Admitting: Cardiovascular Disease

## 2018-07-08 ENCOUNTER — Other Ambulatory Visit: Payer: Self-pay

## 2018-07-08 ENCOUNTER — Encounter: Payer: Self-pay | Admitting: Obstetrics & Gynecology

## 2018-07-08 ENCOUNTER — Ambulatory Visit: Payer: No Typology Code available for payment source | Admitting: Obstetrics & Gynecology

## 2018-07-08 VITALS — BP 116/72

## 2018-07-08 DIAGNOSIS — Z3046 Encounter for surveillance of implantable subdermal contraceptive: Secondary | ICD-10-CM | POA: Diagnosis not present

## 2018-07-08 NOTE — Telephone Encounter (Signed)
Returned call to patient she stated she is having trouble getting monitor to stay on.Advised to rub skin with alcohol let dry then reapply monitor.Advised to call monitor company if continues to come off.

## 2018-07-08 NOTE — Progress Notes (Signed)
    Mary [name] 1970/08/29 [id number]        48 y.o.  G1P0010 Single  RP: Removal of Nexplanon  HPI: Nexplanon inserted in August 2017.  Frequent breakthrough bleeding.  Desires removal.  Prefers observing her natural cycles, declines alternative contraception.  Currently abstinent.   OB History  Gravida Para Term Preterm AB Living  1 0 0 0 1 0  SAB TAB Ectopic Multiple Live Births      1        # Outcome Date GA Lbr Len/2nd Weight Sex Delivery Anes PTL Lv  1 Ectopic             Past medical history,surgical history, problem list, medications, allergies, family history and social history were all reviewed and documented in the EPIC chart.   Directed ROS with pertinent positives and negatives documented in the history of present illness/assessment and plan.  Exam:  Vitals:   07/08/18 1133  BP: 116/72   General appearance:  Normal                                                             Nexplanon procedure note (removal)  The patient presented to the office today requesting for removal of her Nexplanon that was placed in the year 09/2015 on her Left arm.   On examination the nexplanon implant was palpated and the distal end  (end  closest to the elbow) was marked. The area was sterilized with Betadine solution. 1% lidocaine was used for local anesthesia and approximately 1 cc  was injected into the site that was marked where the incision was to be made. The local anesthetic was injected under the implant in an effort to keep it  close to the skin surface. Slight pressure pushing downward was made at the proximal end  of the implant in an effort to stabilize it. A bulge appeared indicating the distal end of the implant. A small transverse incision of 2 mm was made at that location. By gently pushing the implant toward the incision, the tip became visible. Grasping the implant with a curved forcep facilitated in gently removing the implant. Full confirmation of the entire  implant which is 4 cm long was inspected and was intact and was shown to the patient and discarded. After removing the implant, the incision was closed with 3Steri-Strips, a band-aid and a bandage. Patient will be instructed to remove the pressure bandage in 24 hours, the band-aid in 3 days and the Steri-Strips in 7 days.   Assessment/Plan:  48 y.o. G1P0010   1. Encounter for Nexplanon removal Easily removal of Nexplanon.  Nexplanon intact and complete, shown to patient and discarded.  No complication occurred.  Precautions discussed.  Patient is currently abstinent.  Declines alternative contraception.  Princess Bruins MD, 11:57 AM 07/08/2018

## 2018-07-08 NOTE — Telephone Encounter (Signed)
New message:    Patient calling her monitor keeps falling off. Patient would like for some one to call her back concering what she should do next. Patient is dizzy all the time now.

## 2018-07-08 NOTE — Patient Instructions (Signed)
1. Encounter for Nexplanon removal Easily removal of Nexplanon.  Nexplanon intact and complete, shown to patient and discarded.  No complication occurred.  Precautions discussed.  Patient is currently abstinent.  Declines alternative contraception.  Sharyn Lull, good seeing you today!

## 2018-07-10 MED FILL — DOXYCYCLINE HYCLATE 100 MG: 100 | 30 days supply | Qty: 30 | Fill #0

## 2018-07-11 ENCOUNTER — Ambulatory Visit (INDEPENDENT_AMBULATORY_CARE_PROVIDER_SITE_OTHER): Payer: No Typology Code available for payment source | Admitting: Clinical

## 2018-07-11 DIAGNOSIS — F332 Major depressive disorder, recurrent severe without psychotic features: Secondary | ICD-10-CM | POA: Diagnosis not present

## 2018-07-11 MED FILL — TRETINOIN 0.05 % CREA: 0.05 | 20 days supply | Qty: 45 | Fill #2

## 2018-07-11 MED FILL — MIRVASO 0.33% GEL PUMP: 0.33 | 30 days supply | Qty: 30 | Fill #0

## 2018-07-15 ENCOUNTER — Ambulatory Visit: Payer: No Typology Code available for payment source | Admitting: Clinical

## 2018-07-15 ENCOUNTER — Ambulatory Visit: Payer: Self-pay | Admitting: *Deleted

## 2018-07-15 NOTE — Telephone Encounter (Signed)
Spoke with patient regarding symptoms. Offered virtual visit, patient is requesting in-person visit to be assessed by PCP. Denies COVID symptoms. Please advise.

## 2018-07-15 NOTE — Telephone Encounter (Signed)
Pt called with having dizziness going on since march. She was referred to cardiology and wore a halter monitor for 2 weeks. She just sent the monitor off Sunday. But she wants her provider to know that she is still having the same symptoms. Still having fatigue and sore muscles. She denies have a fast heart beat.  She even feels fatigue in her legs,  She has been trying to take Advil (200 mg) a day. She does not feel like it diet related. Advised her to also try the extra strength Tylenol in between times. She just wants her pcp to know what is going on with her and any other recommendations to please give her a call back. Routing to flow at [facility] [place].  Reason for Disposition . Dizziness is a chronic symptom (recurrent or ongoing AND present > 4 weeks)  Answer Assessment - Initial Assessment Questions 1. DESCRIPTION: "Describe your dizziness." Lightheaded  2. LIGHTHEADED: "Do you feel lightheaded?" (e.g., somewhat faint, woozy, weak upon standing)     yes 3. VERTIGO: "Do you feel like either you or the room is spinning or tilting?" (i.e. vertigo)     no 4. SEVERITY: "How bad is it?"  "Do you feel like you are going to faint?" "Can you stand and walk?"   - MILD - walking normally   - MODERATE - interferes with normal activities (e.g., work, school)    - SEVERE - unable to stand, requires support to walk, feels like passing out now.      mild 5. ONSET:  "When did the dizziness begin?"     Started early March 6. AGGRAVATING FACTORS: "Does anything make it worse?" (e.g., standing, change in head position)     Cant tell if there is anything that makes it worst 7. HEART RATE: "Can you tell me your heart rate?" "How many beats in 15 seconds?"  (Note: not all patients can do this)      Can not 8. CAUSE: "What do you think is causing the dizziness?"     Not sure 9. RECURRENT SYMPTOM: "Have you had dizziness before?" If so, ask: "When was the last time?" "What happened that time?"  Yes but so infrequently 10. OTHER SYMPTOMS: "Do you have any other symptoms?" (e.g., fever, chest pain, vomiting, diarrhea, bleeding)       no 11. PREGNANCY: "Is there any chance you are pregnant?" "When was your last menstrual period?"       Not pregnant. Boyfriend has had a vasectomy  Protocols used: DIZZINESS [facility]

## 2018-07-15 NOTE — Telephone Encounter (Signed)
Forwarding to [facility] clinical team

## 2018-07-16 NOTE — Telephone Encounter (Signed)
Pt was called and message was left to return call to schedule in office visit

## 2018-07-16 NOTE — Telephone Encounter (Signed)
In person visit cleared for an end of day appt at 300.

## 2018-07-17 ENCOUNTER — Telehealth: Payer: Self-pay | Admitting: Cardiovascular Disease

## 2018-07-17 NOTE — Telephone Encounter (Signed)
New Message   Patient calling for heart monitor test results.  Also states she's still having dizziness issues and wants to discuss what else should she do.

## 2018-07-17 NOTE — Telephone Encounter (Signed)
Follow up  ° ° °Pt is returning call  ° ° °Please call back  °

## 2018-07-17 NOTE — Telephone Encounter (Signed)
Pt was called and scheduled for in office visit to be evaluated

## 2018-07-17 NOTE — Telephone Encounter (Signed)
Looks like it was ordered but not resulted

## 2018-07-17 NOTE — Telephone Encounter (Signed)
LM2CB When did she return monitor? Still having dizziness?

## 2018-07-18 NOTE — Telephone Encounter (Signed)
LDm2cb when did she return monitor?

## 2018-07-21 NOTE — Telephone Encounter (Signed)
Unable to reach pt or leave a message, per the monitor techs the monitor was received by [employer] today and will take 24-48 hours to be resulted.

## 2018-07-21 NOTE — Telephone Encounter (Signed)
Staff message sent to monitor techs to review

## 2018-07-22 ENCOUNTER — Other Ambulatory Visit: Payer: Self-pay

## 2018-07-22 ENCOUNTER — Encounter: Payer: Self-pay | Admitting: Family Medicine

## 2018-07-22 ENCOUNTER — Ambulatory Visit (INDEPENDENT_AMBULATORY_CARE_PROVIDER_SITE_OTHER): Payer: No Typology Code available for payment source | Admitting: Family Medicine

## 2018-07-22 VITALS — BP 99/66 | HR 74 | Temp 98.1°F | Resp 18 | Ht 65.0 in | Wt 161.2 lb

## 2018-07-22 DIAGNOSIS — R5383 Other fatigue: Secondary | ICD-10-CM | POA: Diagnosis not present

## 2018-07-22 DIAGNOSIS — D219 Benign neoplasm of connective and other soft tissue, unspecified: Secondary | ICD-10-CM

## 2018-07-22 DIAGNOSIS — F450 Somatization disorder: Secondary | ICD-10-CM

## 2018-07-22 DIAGNOSIS — F419 Anxiety disorder, unspecified: Secondary | ICD-10-CM | POA: Diagnosis not present

## 2018-07-22 DIAGNOSIS — E559 Vitamin D deficiency, unspecified: Secondary | ICD-10-CM

## 2018-07-22 DIAGNOSIS — E538 Deficiency of other specified B group vitamins: Secondary | ICD-10-CM

## 2018-07-22 DIAGNOSIS — R000 Tachycardia, unspecified: Secondary | ICD-10-CM

## 2018-07-22 DIAGNOSIS — K5909 Other constipation: Secondary | ICD-10-CM

## 2018-07-22 DIAGNOSIS — M791 Myalgia, unspecified site: Secondary | ICD-10-CM

## 2018-07-22 DIAGNOSIS — K581 Irritable bowel syndrome with constipation: Secondary | ICD-10-CM

## 2018-07-22 DIAGNOSIS — F329 Major depressive disorder, single episode, unspecified: Secondary | ICD-10-CM

## 2018-07-22 DIAGNOSIS — R42 Dizziness and giddiness: Secondary | ICD-10-CM

## 2018-07-22 DIAGNOSIS — L52 Erythema nodosum: Secondary | ICD-10-CM

## 2018-07-22 DIAGNOSIS — R002 Palpitations: Secondary | ICD-10-CM

## 2018-07-22 MED ORDER — CYANOCOBALAMIN 1000 MCG/ML IJ SOLN
1000.0000 ug | Freq: Once | INTRAMUSCULAR | Status: AC
  Administered 2018-07-22: 1000 ug via INTRAMUSCULAR

## 2018-07-22 MED ORDER — NORTRIPTYLINE HCL 25 MG PO CAPS: ORAL_CAPSULE | ORAL | 0 refills | Status: DC

## 2018-07-22 MED FILL — buPROPion HCL ER (XL) 300 M: 300 | 90 days supply | Qty: 90 | Fill #0

## 2018-07-22 MED FILL — NORTRIPTYLINE HCL CAP 25 MG: 25 | 90 days supply | Qty: 180 | Fill #0

## 2018-07-22 NOTE — Progress Notes (Signed)
Mary [name] , 1970-04-08, 48 y.o., female MRN: [id number] Patient Care Team    Relationship Specialty Notifications Start End  Ma Hillock, DO PCP - General Family Medicine  03/12/17   Gerda Diss, DO Consulting Physician Sports Medicine  02/21/18   Gavin Pound, MD Consulting Physician Rheumatology  07/23/18   Linda Hedges, [place] Physician Obstetrics and Gynecology  07/23/18     Chief Complaint  Patient presents with  . Fatigue    Pt has complains of fatigue and muscle pain.   . Muscle Pain     Subjective: Pt presents for an OV with complaints of continue fatigue and polyarthralgia/myalgia. Her dizziness has also continued.  Patient has had chronic complaints of fatigue, dizziness, tachycardia, palpitations and myalgias for quite some time.  She is under treatment for anxiety, depression with somatization, IBS.  She has recently been evaluated by cardiology for her dizziness, tachycardia and palpitations.  She has turned in her cardiac monitor and is waiting for the results.  She reports she is still having rather severe discomfort in her hips thighs lower extremities and arms.  Depression was being treated with Wellbutrin and Effexor and she did not like the side effects she was experiencing from the Effexor and thought maybe the dizziness was coming from that medication.  She has tapered completely off the Effexor.  Her symptoms still remain.  She also has intermittent erythema nodosum of her lower extremities.  She has a history of psoriatic skin conditions.  He is under the care of dermatology. CMP, troponin, CBC, d-dimer normal 06/21/2018. TSH, T4 normal 04/18/2018   Depression screen [facility] 2/9 04/08/2018 06/20/2017 03/12/2017 03/12/2017  Decreased Interest 2 2 1  0  Down, Depressed, Hopeless 2 2 0 0  PHQ - 2 Score 4 4 1  0  Altered sleeping 2 2 0 -  Tired, decreased energy 3 2 1  -  Change in appetite 2 2 1  -  Feeling bad or failure about yourself  0 1 0 -  Trouble  concentrating 1 0 0 -  Moving slowly or fidgety/restless 0 0 0 -  Suicidal thoughts 0 0 0 -  PHQ-9 Score 12 11 3  -  Difficult doing work/chores Somewhat difficult - Not difficult at all -    Allergies  Allergen Reactions  . Molds & Smuts Shortness Of Breath  . Effexor [Venlafaxine] Nausea Only   Social History   Social History Narrative   Single. Bachelor's degree. Works as a traveling Therapist, sports.   Wears a bicycle helmet.   Vegetarian diet.   Drinks caffeine.   Smoke alarm in the home. Wears her seatbelt.   Feels safe in her relationships.   Past Medical History:  Diagnosis Date  . Arthritis    Neck, left ankle, clavicle  . Chickenpox 1987  . Chronic constipation   . Costochondritis   . Depression with somatization   . Erythema nodosum   . Fibroid uterus   . Gastritis 2018  . GERD (gastroesophageal reflux disease)   . Hemorrhoid   . History of PCR DNA positive for HSV1    buttocks area  . Psoriasis   . Rosacea   . Stomach ulcer 2007   Past Surgical History:  Procedure Laterality Date  . COLONOSCOPY WITH ESOPHAGOGASTRODUODENOSCOPY (EGD)  2007, 2008   Patient reports colonoscopy was unsuccessful secondary to her not being able to tolerate prep. Was being completed for chronic constipation.  . ESOPHAGOGASTRODUODENOSCOPY  06/28/2016   gastritis; gastric antrum  .  SALPINGECTOMY Right 2004   ectopic pregnancy  . TONSILLECTOMY  2004   Family History  Problem Relation Age of Onset  . Gout Mother   . Rheum arthritis Mother   . Osteoarthritis Mother   . Breast cancer Mother   . Depression Mother   . Coronary artery disease Mother   . Hyperlipidemia Mother   . Hypertension Mother   . Kidney disease Mother   . Alcohol abuse Father   . Arthritis Father   . Hearing loss Father   . Hyperlipidemia Father   . Hypertension Father   . Psoriasis Father   . Arthritis Brother   . Hyperlipidemia Brother   . Hypertension Brother   . Psoriasis Brother   . Breast cancer  Maternal Grandmother   . Heart murmur Maternal Grandmother   . Breast cancer Paternal Grandmother   . Psoriasis Paternal Grandmother   . Dementia Paternal Grandmother   . Heart attack Paternal Grandfather   . Stroke Paternal Grandfather   . Coronary artery disease Paternal Grandfather    Allergies as of 07/22/2018      Reactions   Molds & Smuts Shortness Of Breath   Effexor [venlafaxine] Nausea Only      Medication List       Accurate as of July 22, 2018 11:59 PM. If you have any questions, ask your nurse or doctor.        STOP taking these medications   venlafaxine XR 75 MG 24 hr capsule Commonly known as:  EFFEXOR-XR Stopped by:  Howard Pouch, DO   Xiidra 5 % Soln Generic drug:  Lifitegrast Stopped by:  Howard Pouch, DO     TAKE these medications   Alrex 0.2 % Susp Generic drug:  loteprednol Place 1 drop into both eyes 2 (two) times daily as needed (itching).   buPROPion 300 MG 24 hr tablet Commonly known as:  WELLBUTRIN XL Take 1 tablet (300 mg total) by mouth daily.   cholecalciferol 25 MCG (1000 UT) tablet Commonly known as:  VITAMIN D3 Take 1,000 Units by mouth daily.   clobetasol cream 0.05 % Commonly known as:  TEMOVATE Apply 1 application topically 2 (two) times daily.   ibuprofen 200 MG tablet Commonly known as:  ADVIL Take 400 mg by mouth every 8 (eight) hours as needed.   linaclotide 145 MCG Caps capsule Commonly known as:  LINZESS Take 1 capsule (145 mcg total) by mouth daily before breakfast.   nortriptyline 25 MG capsule Commonly known as:  PAMELOR 1 cap PO QHS for 14 night,  then increase to 2 tabs QHS if needed. Started by:  Howard Pouch, DO   PRESCRIPTION MEDICATION Place 1 drop into both ears 2 (two) times a day. Mometamax Otic Suspension   tretinoin 0.05 % cream Commonly known as:  RETIN-A Apply topically at bedtime.   valACYclovir 500 MG tablet Commonly known as:  VALTREX Take 1,000 mg by mouth 2 (two) times daily as needed  (outbreaks).   VITAMIN B-12 CR PO Take 3,000 mcg by mouth daily.       All past medical history, surgical history, allergies, family history, immunizations andmedications were updated in the EMR today and reviewed under the history and medication portions of their EMR.     ROS: Negative, with the exception of above mentioned in HPI   Objective:  BP 99/66 (BP Location: Left Arm, Patient Position: Sitting, Cuff Size: Normal)   Pulse 74   Temp 98.1 F (36.7 C) (Oral)  Resp 18   Ht 5\' 5"  (1.651 m)   Wt 161 lb 4 oz (73.1 kg)   SpO2 98%   BMI 26.83 kg/m  Body mass index is 26.83 kg/m. Gen: Afebrile. No acute distress. Nontoxic in appearance, well developed, well nourished.  Mildly overweight Caucasian female. HENT: AT. [place].MMM Eyes:Pupils Equal Round Reactive to light, Extraocular movements intact,  Conjunctiva without redness, discharge or icterus. Neck/lymp/endocrine: Supple,no lymphadenopathy, no thyromegaly CV: RRR no murmur, no edema Chest: CTAB, no wheeze or crackles. Good air movement, normal resp effort.  MSK: X3 erythema nodosum right lower extremity.  Patient with discomfort to palpation of bilateral upper extremities and lower extremities, along IT band, along knee and calves. Skin: no rashes, purpura or petechiae.  Neuro: Normal gait. PERLA. EOMi. Alert. Oriented x3  Psych: Makes good eye contact.  Appears down but still reacts appropriately with smile and laughter.  Normal  dress and demeanor. Normal speech. Normal thought content and judgment.  No exam data present No results found. No results found for this or any previous visit (from the past 24 hour(s)).  Assessment/Plan: Amna Welker is a 48 y.o. female present for OV for  Chronic constipation/Irritable bowel syndrome with constipation -Prescribed Linzess.  Amitriptyline trial started for her myalgias may also benefit IBS.  Fatigue/Myalgia/Depression with somatization/Anxiety Ongoing fatigue with  bilateral upper and lower extremity myalgias that seem to be worsening.  Will start autoimmune work-up and collect Lyme titers.  She already has an appointment with rheumatology, placed referral for her in the event she needs one placed prior to being seen. Discussed role of amitriptyline nightly and she is agreeable to try this today. - Sedimentation rate; Future - C-reactive protein; Future - ANA, IFA Comprehensive Panel-([facility]); Future - Rheumatoid Factor; Future - Cyclic citrul peptide antibody, IgG ([facility]); Future - B. burgdorfi antibodies; Future - Iron, TIBC and Ferritin Panel; Future - nortriptyline (PAMELOR) 25 MG capsule; 1 cap PO QHS for 14 night,  then increase to 2 tabs QHS if needed.  Dispense: 180 capsule; Refill: 0 - Ambulatory referral to Rheumatology  Vitamin D deficiency Continue vitamin D 1000 units daily. - Vitamin D (25 hydroxy); Future  B12 deficiency - B12; Future - cyanocobalamin ((VITAMIN B-12)) injection 1,000 mcg  Palpitations/Tachycardia/Dizziness Awaiting cardiology input.  Cardiac monitor completed and awaiting on results.  Fibroids - Ambulatory referral to Gynecology  Erythema nodosum Defer to dermatology.No longer on birth control.    Reviewed expectations re: course of current medical issues.  Discussed self-management of symptoms.  Outlined signs and symptoms indicating need for more acute intervention.  Patient verbalized understanding and all questions were answered.  Patient received an After-Visit Summary.    Orders Placed This Encounter  Procedures  . Sedimentation rate  . C-reactive protein  . ANA, IFA Comprehensive Panel-([facility])  . Rheumatoid Factor  . Cyclic citrul peptide antibody, IgG ([facility])  . B. burgdorfi antibodies  . Iron, TIBC and Ferritin Panel  . Vitamin D (25 hydroxy)  . B12  . Ambulatory referral to Gynecology  . Ambulatory referral to Rheumatology    > 25 minutes spent with patient, >50% of time spent  face to face    Note is dictated utilizing voice recognition software. Although note has been proof read prior to signing, occasional typographical errors still can be missed. If any questions arise, please do not hesitate to call for verification.   electronically signed by:  Howard Pouch, DO  [place]

## 2018-07-22 NOTE — Patient Instructions (Signed)
Start nortriptyline before bed.  Labs in 1 week. We will call you after results.

## 2018-07-23 ENCOUNTER — Encounter: Payer: Self-pay | Admitting: Family Medicine

## 2018-07-23 DIAGNOSIS — M791 Myalgia, unspecified site: Secondary | ICD-10-CM | POA: Insufficient documentation

## 2018-07-23 DIAGNOSIS — R42 Dizziness and giddiness: Secondary | ICD-10-CM | POA: Insufficient documentation

## 2018-07-23 DIAGNOSIS — E538 Deficiency of other specified B group vitamins: Secondary | ICD-10-CM | POA: Insufficient documentation

## 2018-07-23 DIAGNOSIS — E559 Vitamin D deficiency, unspecified: Secondary | ICD-10-CM | POA: Insufficient documentation

## 2018-07-23 DIAGNOSIS — L52 Erythema nodosum: Secondary | ICD-10-CM | POA: Insufficient documentation

## 2018-07-23 DIAGNOSIS — D219 Benign neoplasm of connective and other soft tissue, unspecified: Secondary | ICD-10-CM | POA: Insufficient documentation

## 2018-07-24 ENCOUNTER — Other Ambulatory Visit: Payer: Self-pay

## 2018-07-25 ENCOUNTER — Ambulatory Visit (INDEPENDENT_AMBULATORY_CARE_PROVIDER_SITE_OTHER): Payer: No Typology Code available for payment source | Admitting: Clinical

## 2018-07-25 DIAGNOSIS — F332 Major depressive disorder, recurrent severe without psychotic features: Secondary | ICD-10-CM | POA: Diagnosis not present

## 2018-07-29 ENCOUNTER — Ambulatory Visit: Payer: No Typology Code available for payment source | Admitting: Obstetrics & Gynecology

## 2018-07-31 ENCOUNTER — Telehealth: Payer: Self-pay | Admitting: Cardiovascular Disease

## 2018-07-31 NOTE — Telephone Encounter (Signed)
Attempted to contact patient to notify of results.  LVM advising to call back.

## 2018-07-31 NOTE — Telephone Encounter (Signed)
New message   Patient is calling to get monitor results. Please call.

## 2018-08-01 NOTE — Telephone Encounter (Signed)
Called patient, advised of monitor results- patient was advised from visit that she should follow up in 6-8 weeks, but she states she is still having dizziness, and the same issues and wanted to know what to do in the waiting time.  Advised I would route message to MD and nurse.

## 2018-08-01 NOTE — Telephone Encounter (Signed)
The monitor only showed occasional PACs and PVCs, no arrhythmias.

## 2018-08-04 ENCOUNTER — Ambulatory Visit: Payer: No Typology Code available for payment source

## 2018-08-04 NOTE — Addendum Note (Signed)
Addended by: Ralph Dowdy on: 08/04/2018 03:23 PM   Modules accepted: Orders

## 2018-08-05 ENCOUNTER — Other Ambulatory Visit: Payer: Self-pay

## 2018-08-05 ENCOUNTER — Ambulatory Visit (INDEPENDENT_AMBULATORY_CARE_PROVIDER_SITE_OTHER): Payer: No Typology Code available for payment source

## 2018-08-05 DIAGNOSIS — F419 Anxiety disorder, unspecified: Secondary | ICD-10-CM

## 2018-08-05 DIAGNOSIS — M791 Myalgia, unspecified site: Secondary | ICD-10-CM

## 2018-08-05 DIAGNOSIS — E538 Deficiency of other specified B group vitamins: Secondary | ICD-10-CM

## 2018-08-05 DIAGNOSIS — R5383 Other fatigue: Secondary | ICD-10-CM

## 2018-08-05 DIAGNOSIS — E559 Vitamin D deficiency, unspecified: Secondary | ICD-10-CM

## 2018-08-05 MED FILL — buPROPion HCL ER (XL) 300 M: 300 | 90 days supply | Qty: 90 | Fill #0

## 2018-08-05 MED FILL — NORTRIPTYLINE HCL CAP 25 MG: 25 | 90 days supply | Qty: 180 | Fill #0

## 2018-08-07 LAB — SEDIMENTATION RATE: Sed Rate: 2 mm/h (ref 0–20)

## 2018-08-07 LAB — ANA, IFA COMPREHENSIVE PANEL
Anti Nuclear Antibody (ANA): NEGATIVE
ENA SM Ab Ser-aCnc: <1 AI
SM/RNP: <1 AI
SSA (Ro) (ENA) Antibody, IgG: <1 AI
SSB (La) (ENA) Antibody, IgG: <1 AI
Scleroderma (Scl-70) (ENA) Antibody, IgG: <1 AI
ds DNA Ab: 2 IU/mL

## 2018-08-07 LAB — HM PAP SMEAR: HM Pap smear: NORMAL

## 2018-08-07 LAB — RHEUMATOID FACTOR: Rheumatoid fact SerPl-aCnc: <14 IU/mL (ref <14)

## 2018-08-07 LAB — VITAMIN D 25 HYDROXY (VIT D DEFICIENCY, FRACTURES): Vit D, 25-Hydroxy: 44 ng/mL (ref 30–100)

## 2018-08-07 LAB — IRON,TIBC AND FERRITIN PANEL
%SAT: 40 % (calc) (ref 16–45)
Ferritin: 69 ng/mL (ref 16–232)
Iron: 126 ug/dL (ref 40–190)
TIBC: 316 mcg/dL (calc) (ref 250–450)

## 2018-08-07 LAB — C-REACTIVE PROTEIN: CRP: 0.5 mg/L (ref <8.0)

## 2018-08-07 LAB — VITAMIN B12: Vitamin B-12: 533 pg/mL (ref 200–1100)

## 2018-08-07 LAB — CYCLIC CITRUL PEPTIDE ANTIBODY, IGG: Cyclic Citrullin Peptide Ab: <16 UNITS

## 2018-08-07 LAB — B. BURGDORFI ANTIBODIES: B burgdorferi Ab IgG+IgM: <0.9 index

## 2018-08-08 ENCOUNTER — Ambulatory Visit: Payer: No Typology Code available for payment source | Admitting: Clinical

## 2018-08-11 ENCOUNTER — Other Ambulatory Visit: Payer: Self-pay | Admitting: Obstetrics and Gynecology

## 2018-08-11 ENCOUNTER — Other Ambulatory Visit (HOSPITAL_COMMUNITY): Payer: Self-pay | Admitting: Obstetrics and Gynecology

## 2018-08-11 DIAGNOSIS — Z9189 Other specified personal risk factors, not elsewhere classified: Secondary | ICD-10-CM

## 2018-08-22 ENCOUNTER — Ambulatory Visit (INDEPENDENT_AMBULATORY_CARE_PROVIDER_SITE_OTHER): Payer: No Typology Code available for payment source | Admitting: Clinical

## 2018-08-22 DIAGNOSIS — F332 Major depressive disorder, recurrent severe without psychotic features: Secondary | ICD-10-CM

## 2018-08-27 ENCOUNTER — Telehealth: Payer: Self-pay

## 2018-08-27 ENCOUNTER — Ambulatory Visit (INDEPENDENT_AMBULATORY_CARE_PROVIDER_SITE_OTHER): Payer: No Typology Code available for payment source | Admitting: Family Medicine

## 2018-08-27 ENCOUNTER — Encounter: Payer: Self-pay | Admitting: Family Medicine

## 2018-08-27 DIAGNOSIS — R5383 Other fatigue: Secondary | ICD-10-CM

## 2018-08-27 DIAGNOSIS — F329 Major depressive disorder, single episode, unspecified: Secondary | ICD-10-CM

## 2018-08-27 DIAGNOSIS — M791 Myalgia, unspecified site: Secondary | ICD-10-CM | POA: Diagnosis not present

## 2018-08-27 DIAGNOSIS — F32A Depression, unspecified: Secondary | ICD-10-CM

## 2018-08-27 NOTE — Telephone Encounter (Signed)
Pt calling in.  States that she needs to speak with someone about the letter that will go to [employer] after speaking with her supervisor. Pt can be reached at [phone].

## 2018-08-27 NOTE — Telephone Encounter (Signed)
Copied from [place] [id number]. Topic: General - Other >> Aug 27, 2018 12:31 PM Wynetta Emery, [place] C wrote: Reason for CRM: pt called in to request to have work note sent to: [employer] [facility] [email or web address]   Please advise if note is to be written and for what days.   Thanks

## 2018-08-27 NOTE — Telephone Encounter (Signed)
Aware of conditions. Printed excuse for her and it will need to be sent via email by staff.

## 2018-08-27 NOTE — Telephone Encounter (Signed)
Pt would like note to say she can only work 2 days in a row and the 3rd day needs to be a random day during the week. Pt says she physically  Cannot work three days in a row and states she will "lose her job" if she has to work 3 days in a row.

## 2018-08-27 NOTE — Telephone Encounter (Signed)
Attempted to call pt. Unable to leave message as mailbox is full.

## 2018-08-27 NOTE — Progress Notes (Signed)
VIRTUAL VISIT VIA VIDEO  I connected with Mary [name] on 08/27/18 at 11:30 AM EDT by a video enabled telemedicine application and verified that I am speaking with the correct person using two identifiers. Location patient: Home Location provider: [facility], Office Persons participating in the virtual visit: Patient, Dr. Raoul Pitch and R.Baker, LPN  I discussed the limitations of evaluation and management by telemedicine and the availability of in person appointments. The patient expressed understanding and agreed to proceed.    Mary [name] , [birth year], 48 y.o., female MRN: [id number] Patient Care Team    Relationship Specialty Notifications Start End  Ma Hillock, DO PCP - General Family Medicine  03/12/17   Gerda Diss, DO Consulting Physician Sports Medicine  02/21/18   Gavin Pound, MD Consulting Physician Rheumatology  07/23/18   Linda Hedges, [place] Physician Obstetrics and Gynecology  07/23/18     Chief Complaint  Patient presents with  . Muscle Pain    2 weeks ago pt was at work and had chills, stomach pain, vomitting, with fever. Pt was tested for COVID and it was Neg. GYN suggested she has a hysterectomy. Rheumotologist was concerned with joint pain and everything was neg. Pt dx with osteoarthritis and pain was for fibro. She needs release to go back to work because she has COVID symptoms. Wanting to discuss pain in joints.   . Fatigue     Subjective: Pt presents for an OV to discuss her return to work note.  Patient reports she had COVID-like symptoms and was tested on August 21, 2018 and was negative per her report.  She was tested through health at work.  She then had additional symptoms of stomach discomfort, nausea and diarrhea.  He is her acute on chronic problems for her.  She feels swollen and bloated.  She has chronic constipation has been provided with prescription for amities and Linzess.  She also has recently seen her gynecologist which  has diagnosed her with rather significant uterine fibroids and suggested a hysterectomy.  Patient is not infectious.  She is having symptoms of myalgias, fatigue which are chronic conditions for her.  She has been seen by multiple specialist and has been evaluated for autoimmune disorder.  It is felt that she likely has fibromyalgia or chronic fatigue syndrome and is not infectious.  Depression screen [facility] 2/9 04/08/2018 06/20/2017 03/12/2017 03/12/2017  Decreased Interest 2 2 1  0  Down, Depressed, Hopeless 2 2 0 0  PHQ - 2 Score 4 4 1  0  Altered sleeping 2 2 0 -  Tired, decreased energy 3 2 1  -  Change in appetite 2 2 1  -  Feeling bad or failure about yourself  0 1 0 -  Trouble concentrating 1 0 0 -  Moving slowly or fidgety/restless 0 0 0 -  Suicidal thoughts 0 0 0 -  PHQ-9 Score 12 11 3  -  Difficult doing work/chores Somewhat difficult - Not difficult at all -    Allergies  Allergen Reactions  . Molds & Smuts Shortness Of Breath  . Effexor [Venlafaxine] Nausea Only   Social History   Social History Narrative   Single. Bachelor's degree. Works as a traveling Therapist, sports.   Wears a bicycle helmet.   Vegetarian diet.   Drinks caffeine.   Smoke alarm in the home. Wears her seatbelt.   Feels safe in her relationships.   Past Medical History:  Diagnosis Date  . Arthritis    Neck, left  ankle, clavicle  . Chickenpox 1987  . Chronic constipation   . Costochondritis   . Depression with somatization   . Erythema nodosum   . Fibroid uterus   . Gastritis 2018  . GERD (gastroesophageal reflux disease)   . Hemorrhoid   . History of PCR DNA positive for HSV1    buttocks area  . Psoriasis   . Rosacea   . Stomach ulcer 2007   Past Surgical History:  Procedure Laterality Date  . COLONOSCOPY WITH ESOPHAGOGASTRODUODENOSCOPY (EGD)  2007, 2008   Patient reports colonoscopy was unsuccessful secondary to her not being able to tolerate prep. Was being completed for chronic constipation.  .  ESOPHAGOGASTRODUODENOSCOPY  06/28/2016   gastritis; gastric antrum  . SALPINGECTOMY Right 2004   ectopic pregnancy  . TONSILLECTOMY  2004   Family History  Problem Relation Age of Onset  . Gout Mother   . Rheum arthritis Mother   . Osteoarthritis Mother   . Breast cancer Mother   . Depression Mother   . Coronary artery disease Mother   . Hyperlipidemia Mother   . Hypertension Mother   . Kidney disease Mother   . Alcohol abuse Father   . Arthritis Father   . Hearing loss Father   . Hyperlipidemia Father   . Hypertension Father   . Psoriasis Father   . Arthritis Brother   . Hyperlipidemia Brother   . Hypertension Brother   . Psoriasis Brother   . Breast cancer Maternal Grandmother   . Heart murmur Maternal Grandmother   . Breast cancer Paternal Grandmother   . Psoriasis Paternal Grandmother   . Dementia Paternal Grandmother   . Heart attack Paternal Grandfather   . Stroke Paternal Grandfather   . Coronary artery disease Paternal Grandfather    Allergies as of 08/27/2018      Reactions   Molds & Smuts Shortness Of Breath   Effexor [venlafaxine] Nausea Only      Medication List       Accurate as of August 27, 2018  5:09 PM. If you have any questions, ask your nurse or doctor.        Alrex 0.2 % Susp Generic drug: loteprednol Place 1 drop into both eyes 2 (two) times daily as needed (itching).   buPROPion 300 MG 24 hr tablet Commonly known as: WELLBUTRIN XL Take 1 tablet (300 mg total) by mouth daily.   cholecalciferol 25 MCG (1000 UT) tablet Commonly known as: VITAMIN D3 Take 1,000 Units by mouth daily.   clobetasol cream 0.05 % Commonly known as: TEMOVATE Apply 1 application topically 2 (two) times daily.   ibuprofen 200 MG tablet Commonly known as: ADVIL Take 400 mg by mouth every 8 (eight) hours as needed.   linaclotide 145 MCG Caps capsule Commonly known as: LINZESS Take 1 capsule (145 mcg total) by mouth daily before breakfast.   nortriptyline 25  MG capsule Commonly known as: PAMELOR 1 cap PO QHS for 14 night,  then increase to 2 tabs QHS if needed.   PRESCRIPTION MEDICATION Place 1 drop into both ears 2 (two) times a day. Mometamax Otic Suspension   tretinoin 0.05 % cream Commonly known as: RETIN-A Apply topically at bedtime.   valACYclovir 500 MG tablet Commonly known as: VALTREX Take 1,000 mg by mouth 2 (two) times daily as needed (outbreaks).   VITAMIN B-12 CR PO Take 3,000 mcg by mouth daily.       All past medical history, surgical history, allergies, family history, immunizations  andmedications were updated in the EMR today and reviewed under the history and medication portions of their EMR.     ROS: Negative, with the exception of above mentioned in HPI   Objective:  There were no vitals taken for this visit. There is no height or weight on file to calculate BMI. Gen: Afebrile. No acute distress. Nontoxic in appearance, well developed, well nourished.  HENT: AT. [place].  MMM Eyes:Pupils Equal Round Reactive to light, Extraocular movements intact,  Conjunctiva without redness, discharge or icterus. Chest: No cough.  No shortness of breath. Skin: No rashes, purpura or petechiae.  Neuro:  Normal gait. PERLA. EOMi. Alert. Oriented x3  Psych: Tearful at times.  Normal affect, dress and demeanor. Normal speech. Normal thought content and judgment.  No exam data present No results found. No results found for this or any previous visit (from the past 24 hour(s)).  Assessment/Plan: Assata Juncaj is a 48 y.o. female present for OV for  Myalgia/Fatigue due to depression Patient's reported symptoms are not secondary to any infectious cause.  She is under the care of multiple specialists for her symptoms which are felt to be related to chronic fatigue syndrome/fibromyalgia and abdominal discomfort secondary to her chronic IBS and possibly uterine fibroids playing a role. Patient can return to work immediately.  Note  will be written that patient can return to work immediately, but avoidance of more than two 12-hour shifts in a row-spacing out third 12-hour shift, would be helpful with her condition. Follow-up PRN.   Reviewed expectations re: course of current medical issues.  Discussed self-management of symptoms.  Outlined signs and symptoms indicating need for more acute intervention.  Patient verbalized understanding and all questions were answered.  Patient received an After-Visit Summary.    No orders of the defined types were placed in this encounter.    Note is dictated utilizing voice recognition software. Although note has been proof read prior to signing, occasional typographical errors still can be missed. If any questions arise, please do not hesitate to call for verification.   electronically signed by:  Howard Pouch, DO  [place]

## 2018-08-28 NOTE — Telephone Encounter (Signed)
Pt was called and letter was emailed to [facility] by [employer]. Copy in chart

## 2018-08-28 NOTE — Telephone Encounter (Signed)
Attempted to contact pt x2. Unable to leave message as mailbox is full.

## 2018-08-29 ENCOUNTER — Telehealth: Payer: Self-pay | Admitting: Family Medicine

## 2018-08-29 NOTE — Telephone Encounter (Signed)
I called patient to get the dates for her FMLA for COVID symptoms.  08/09/18 Patient was asked to leave from work due to [place] symptoms 08/13/18 Patient received negative COVID 08/21/18 Patient was retested by [facility] still had symptoms 08/24/18 Received negative results, patient was advised by [facility] to make an appointment with PCP, first available appt 08/27/18 08/29/18 Patient returns to work [employer] paperwork in [place] box.

## 2018-09-01 NOTE — Telephone Encounter (Signed)
Placed on Dr Dierdre Highman desk

## 2018-09-04 DIAGNOSIS — Z0279 Encounter for issue of other medical certificate: Secondary | ICD-10-CM

## 2018-09-04 NOTE — Telephone Encounter (Signed)
Completed paperwork. Please copy and scan. Then fax and inform pt they have been completed. I was uncertain the exact reason for these papers since we discussed her not needing papers for the [employer] time off for covid testing. I am assuming then this was needed to cover the spacing of her shifts

## 2018-09-04 NOTE — Telephone Encounter (Signed)
My chart message sent to patient. Labeled and place for scan and sent to diane for charge.

## 2018-09-05 ENCOUNTER — Ambulatory Visit (INDEPENDENT_AMBULATORY_CARE_PROVIDER_SITE_OTHER): Payer: No Typology Code available for payment source | Admitting: Clinical

## 2018-09-05 DIAGNOSIS — F332 Major depressive disorder, recurrent severe without psychotic features: Secondary | ICD-10-CM | POA: Diagnosis not present

## 2018-09-14 ENCOUNTER — Encounter (HOSPITAL_COMMUNITY): Payer: Self-pay | Admitting: Emergency Medicine

## 2018-09-14 ENCOUNTER — Other Ambulatory Visit: Payer: Self-pay

## 2018-09-14 DIAGNOSIS — Z79899 Other long term (current) drug therapy: Secondary | ICD-10-CM | POA: Diagnosis not present

## 2018-09-14 DIAGNOSIS — R5383 Other fatigue: Secondary | ICD-10-CM | POA: Diagnosis not present

## 2018-09-14 DIAGNOSIS — M791 Myalgia, unspecified site: Secondary | ICD-10-CM | POA: Diagnosis present

## 2018-09-14 DIAGNOSIS — R109 Unspecified abdominal pain: Secondary | ICD-10-CM | POA: Diagnosis not present

## 2018-09-14 DIAGNOSIS — R531 Weakness: Secondary | ICD-10-CM | POA: Diagnosis not present

## 2018-09-14 DIAGNOSIS — R0602 Shortness of breath: Secondary | ICD-10-CM | POA: Insufficient documentation

## 2018-09-14 NOTE — ED Triage Notes (Signed)
Patient here from home with complaints of body aches and muscle aches x3 weeks. Reports that she was sent to a rheumatologist for possible lupus but does not know results of lab test. Denies cough, fever.

## 2018-09-15 ENCOUNTER — Encounter (HOSPITAL_COMMUNITY): Payer: Self-pay

## 2018-09-15 ENCOUNTER — Emergency Department (HOSPITAL_COMMUNITY)
Admission: EM | Admit: 2018-09-15 | Discharge: 2018-09-15 | Disposition: A | Discharge status: Home / Self Care | Payer: No Typology Code available for payment source | Attending: Emergency Medicine | Admitting: Emergency Medicine

## 2018-09-15 ENCOUNTER — Emergency Department (HOSPITAL_COMMUNITY): Payer: No Typology Code available for payment source

## 2018-09-15 ENCOUNTER — Other Ambulatory Visit: Payer: Self-pay

## 2018-09-15 DIAGNOSIS — R531 Weakness: Secondary | ICD-10-CM

## 2018-09-15 DIAGNOSIS — R5383 Other fatigue: Secondary | ICD-10-CM

## 2018-09-15 LAB — URINALYSIS, ROUTINE W REFLEX MICROSCOPIC
Bilirubin Urine: NEGATIVE
Glucose, UA: NEGATIVE mg/dL
Hgb urine dipstick: NEGATIVE
Ketones, ur: NEGATIVE mg/dL
Leukocytes,Ua: NEGATIVE
Nitrite: NEGATIVE
Protein, ur: NEGATIVE mg/dL
Specific Gravity, Urine: 1.003 — ABNORMAL LOW (ref 1.005–1.030)
pH: 6 (ref 5.0–8.0)

## 2018-09-15 LAB — COMPREHENSIVE METABOLIC PANEL
ALT: 14 U/L (ref 0–44)
AST: 13 U/L — ABNORMAL LOW (ref 15–41)
Albumin: 4 g/dL (ref 3.5–5.0)
Alkaline Phosphatase: 45 U/L (ref 38–126)
Anion gap: 8 (ref 5–15)
BUN: 13 mg/dL (ref 6–20)
CO2: 23 mmol/L (ref 22–32)
Calcium: 8.8 mg/dL — ABNORMAL LOW (ref 8.9–10.3)
Chloride: 107 mmol/L (ref 98–111)
Creatinine, Ser: 0.77 mg/dL (ref 0.44–1.00)
GFR calc Af Amer: >60 mL/min (ref >60)
GFR calc non Af Amer: >60 mL/min (ref >60)
Glucose, Bld: 91 mg/dL (ref 70–99)
Potassium: 4.1 mmol/L (ref 3.5–5.1)
Sodium: 138 mmol/L (ref 135–145)
Total Bilirubin: 0.8 mg/dL (ref 0.3–1.2)
Total Protein: 6.7 g/dL (ref 6.5–8.1)

## 2018-09-15 LAB — CBC WITH DIFFERENTIAL/PLATELET
Abs Immature Granulocytes: 0.03 10*3/uL (ref 0.00–0.07)
Basophils Absolute: 0.1 10*3/uL (ref 0.0–0.1)
Basophils Relative: 1 %
Eosinophils Absolute: 0.2 10*3/uL (ref 0.0–0.5)
Eosinophils Relative: 3 %
HCT: 42.4 % (ref 36.0–46.0)
Hemoglobin: 13.7 g/dL (ref 12.0–15.0)
Immature Granulocytes: 0 %
Lymphocytes Relative: 24 %
Lymphs Abs: 1.6 10*3/uL (ref 0.7–4.0)
MCH: 29.1 pg (ref 26.0–34.0)
MCHC: 32.3 g/dL (ref 30.0–36.0)
MCV: 90.2 fL (ref 80.0–100.0)
Monocytes Absolute: 0.5 10*3/uL (ref 0.1–1.0)
Monocytes Relative: 7 %
Neutro Abs: 4.5 10*3/uL (ref 1.7–7.7)
Neutrophils Relative %: 65 %
Platelets: 190 10*3/uL (ref 150–400)
RBC: 4.7 MIL/uL (ref 3.87–5.11)
RDW: 11.8 % (ref 11.5–15.5)
WBC: 6.9 10*3/uL (ref 4.0–10.5)
nRBC: 0 % (ref 0.0–0.2)

## 2018-09-15 LAB — CK: Total CK: 55 U/L (ref 38–234)

## 2018-09-15 LAB — HCG, QUANTITATIVE, PREGNANCY: hCG, Beta Chain, Quant, S: <1 m[IU]/mL (ref <5)

## 2018-09-15 MED ORDER — IOHEXOL 300 MG/ML  SOLN
100.0000 mL | Freq: Once | INTRAMUSCULAR | Status: AC | PRN
  Administered 2018-09-15: 04:00:00 100 mL via INTRAVENOUS

## 2018-09-15 MED ORDER — IOHEXOL 300 MG/ML  SOLN
30.0000 mL | Freq: Once | INTRAMUSCULAR | Status: AC | PRN
  Administered 2018-09-15: 30 mL via ORAL

## 2018-09-15 NOTE — ED Provider Notes (Signed)
[facility] DEPT Provider Note   CSN: [id number] Arrival date & time: 09/14/18  2304     History   Chief Complaint Chief Complaint  Patient presents with  . Muscle Pain  . Fatigue    HPI Louise Rawson is a 48 y.o. female.      Muscle Pain This is a chronic problem. The current episode started more than 1 week ago. The problem occurs constantly. The problem has been gradually worsening. Associated symptoms include abdominal pain and shortness of breath. Pertinent negatives include no headaches. Nothing aggravates the symptoms. Nothing relieves the symptoms. She has tried nothing for the symptoms. The treatment provided no relief.    Past Medical History:  Diagnosis Date  . Arthritis    Neck, left ankle, clavicle  . Chickenpox 1987  . Chronic constipation   . Costochondritis   . Depression with somatization   . Erythema nodosum   . Fibroid uterus   . Gastritis 2018  . GERD (gastroesophageal reflux disease)   . Hemorrhoid   . History of PCR DNA positive for HSV1    buttocks area  . Psoriasis   . Rosacea   . Stomach ulcer 2007    Patient Active Problem List   Diagnosis Date Noted  . Erythema nodosum 07/23/2018  . Fibroids 07/23/2018  . B12 deficiency 07/23/2018  . Vitamin D deficiency 07/23/2018  . Myalgia 07/23/2018  . Dizziness 07/23/2018  . Anxiety 05/08/2018  . Palpitations 05/07/2018  . Tachycardia 05/07/2018  . Chronic fatigue syndrome 04/18/2018  . Major depressive disorder 04/08/2018  . Irritable bowel syndrome with constipation 11/19/2017  . De Quervain's tenosynovitis, right 07/03/2017  . Overweight (BMI 25.0-29.9) 06/20/2017  . Multiple pigmented nevi 06/20/2017  . Distal radius fracture, right 05/16/2017  . Tenosynovitis 05/01/2017  . Depression with somatization   . Rosacea 03/12/2017  . Vegetarian diet 03/12/2017  . Chronic constipation 03/12/2017  . Psoriasis 03/12/2017    Past Surgical History:   Procedure Laterality Date  . COLONOSCOPY WITH ESOPHAGOGASTRODUODENOSCOPY (EGD)  2007, 2008   Patient reports colonoscopy was unsuccessful secondary to her not being able to tolerate prep. Was being completed for chronic constipation.  . ESOPHAGOGASTRODUODENOSCOPY  06/28/2016   gastritis; gastric antrum  . SALPINGECTOMY Right 2004   ectopic pregnancy  . TONSILLECTOMY  2004     OB History    Gravida  1   Para  0   Term  0   Preterm  0   AB  1   Living  0     SAB      TAB      Ectopic  1   Multiple      Live Births               Home Medications    Prior to Admission medications   Medication Sig Start Date End Date Taking? Authorizing Provider  B Complex-C (B-COMPLEX WITH VITAMIN C) tablet Take 1 tablet by mouth daily.   Yes [provider]  cholecalciferol (VITAMIN D3) 25 MCG (1000 UT) tablet Take 1,000 Units by mouth daily.   Yes [provider]  ibuprofen (ADVIL,MOTRIN) 200 MG tablet Take 400-600 mg by mouth every 8 (eight) hours as needed for moderate pain.    Yes [provider]  tretinoin (RETIN-A) 0.05 % cream Apply 1 application topically at bedtime as needed.    Yes [provider]  buPROPion (WELLBUTRIN XL) 300 MG 24 hr tablet Take 1 tablet (300  mg total) by mouth daily. Patient not taking: Reported on 09/15/2018 04/08/18 09/15/18  Ma Hillock, DO  linaclotide (LINZESS) 145 MCG CAPS capsule Take 1 capsule (145 mcg total) by mouth daily before breakfast. Patient not taking: Reported on 09/15/2018 12/16/17 09/15/18  Howard Pouch A, DO  nortriptyline (PAMELOR) 25 MG capsule 1 cap PO QHS for 14 night,  then increase to 2 tabs QHS if needed. Patient not taking: Reported on 09/15/2018 07/22/18 09/15/18  Ma Hillock, DO    Family History Family History  Problem Relation Age of Onset  . Gout Mother   . Rheum arthritis Mother   . Osteoarthritis Mother   . Breast cancer Mother   . Depression Mother   . Coronary artery  disease Mother   . Hyperlipidemia Mother   . Hypertension Mother   . Kidney disease Mother   . Alcohol abuse Father   . Arthritis Father   . Hearing loss Father   . Hyperlipidemia Father   . Hypertension Father   . Psoriasis Father   . Arthritis Brother   . Hyperlipidemia Brother   . Hypertension Brother   . Psoriasis Brother   . Breast cancer Maternal Grandmother   . Heart murmur Maternal Grandmother   . Breast cancer Paternal Grandmother   . Psoriasis Paternal Grandmother   . Dementia Paternal Grandmother   . Heart attack Paternal Grandfather   . Stroke Paternal Grandfather   . Coronary artery disease Paternal Grandfather     Social History Social History   Tobacco Use  . Smoking status: Never Smoker  . Smokeless tobacco: Never Used  Substance Use Topics  . Alcohol use: No    Frequency: Never  . Drug use: Yes     Allergies   Molds & smuts and Effexor [venlafaxine]   Review of Systems Review of Systems  Constitutional: Positive for activity change, appetite change and fatigue.  Respiratory: Positive for shortness of breath.   Cardiovascular: Positive for palpitations. Negative for leg swelling.  Gastrointestinal: Positive for abdominal pain, constipation and nausea. Negative for diarrhea and vomiting.  Genitourinary: Negative for dysuria.  Musculoskeletal: Positive for myalgias.  Skin: Positive for rash.  Neurological: Negative for headaches.  All other systems reviewed and are negative.    Physical Exam Updated Vital Signs BP 108/69   Pulse 67   Temp 98.1 F (36.7 C) (Oral)   Resp 18   Ht 5\' 4"  (1.626 m)   Wt 73 kg   SpO2 100%   BMI 27.64 kg/m   Physical Exam Vitals signs and nursing note reviewed.  Constitutional:      Appearance: She is well-developed.  HENT:     Head: Normocephalic and atraumatic.     Mouth/Throat:     Mouth: Mucous membranes are dry.     Pharynx: Oropharynx is clear.  Neck:     Musculoskeletal: Normal range of  motion.  Cardiovascular:     Rate and Rhythm: Normal rate and regular rhythm.  Pulmonary:     Effort: Pulmonary effort is normal. No respiratory distress.     Breath sounds: No stridor.  Abdominal:     General: Bowel sounds are normal. There is no distension.  Musculoskeletal: Normal range of motion.        General: No swelling or tenderness.  Skin:    General: Skin is warm and dry.  Neurological:     General: No focal deficit present.     Mental Status: She is alert.  ED Treatments / Results  Labs (all labs ordered are listed, but only abnormal results are displayed) Labs Reviewed  COMPREHENSIVE METABOLIC PANEL - Abnormal; Notable for the following components:      Result Value   Calcium 8.8 (*)    AST 13 (*)    All other components within normal limits  URINALYSIS, ROUTINE W REFLEX MICROSCOPIC - Abnormal; Notable for the following components:   Color, Urine STRAW (*)    Specific Gravity, Urine 1.003 (*)    All other components within normal limits  CBC WITH DIFFERENTIAL/PLATELET  CK  HCG, QUANTITATIVE, PREGNANCY    EKG EKG Interpretation  Date/Time:  Monday September 15 2018 02:39:28 EDT Ventricular Rate:  64 PR Interval:    QRS Duration: 87 QT Interval:  420 QTC Calculation: 434 R Axis:   104 Text Interpretation:  Right and left arm electrode reversal, interpretation assumes no reversal Sinus rhythm Probable lateral infarct, old inverted QRS in I new, likely related to electrode reversal Confirmed by Merrily Pew [id number]) on 09/15/2018 2:53:36 AM   Radiology Ct Abdomen Pelvis W Contrast  Result Date: 09/15/2018 CLINICAL DATA:  Body aches and muscle aches for 3 weeks. Global abdominal pain for two days. EXAM: CT ABDOMEN AND PELVIS WITH CONTRAST TECHNIQUE: Multidetector CT imaging of the abdomen and pelvis was performed using the standard protocol following bolus administration of intravenous contrast. CONTRAST:  114mL OMNIPAQUE IOHEXOL 300 MG/ML SOLN, 51mL  OMNIPAQUE IOHEXOL 300 MG/ML SOLN COMPARISON:  None. FINDINGS: Lower chest:  No contributory findings. Hepatobiliary: Small cystic density in the inferior right liver.No evidence of biliary obstruction or stone. Pancreas: Unremarkable. Spleen: Unremarkable. Adrenals/Urinary Tract: Negative adrenals. No hydronephrosis or ureteral stone. 3 mm left renal calculus. Unremarkable bladder. Stomach/Bowel:  No obstruction. No appendicitis. Vascular/Lymphatic: No acute vascular abnormality. No mass or adenopathy. Reproductive:Heterogeneous and enlarged uterus attributed to intramural fibroids including a subserosal fibroid measuring 5.7 cm which is pedunculated. Corpus luteum on the left. Less intensely enhancing cervix without masslike thickening. Other: No ascites or pneumoperitoneum. Trace pelvic fluid which may be physiologic. Musculoskeletal: No acute abnormalities. IMPRESSION: 1. No acute finding. 2. Fibroid uterus including a pedunculated 6 cm fibroid. 3. Small left renal calculus. Electronically Signed   By: Monte Fantasia M.D.   On: 09/15/2018 05:03    Procedures Procedures (including critical care time)  Medications Ordered in ED Medications  iohexol (OMNIPAQUE) 300 MG/ML solution 30 mL (30 mLs Oral Contrast Given 09/15/18 0425)  iohexol (OMNIPAQUE) 300 MG/ML solution 100 mL (100 mLs Intravenous Contrast Given 09/15/18 0425)     Initial Impression / Assessment and Plan / ED Course  I have reviewed the triage vital signs and the nursing notes.  Pertinent labs & imaging results that were available during my care of the patient were reviewed by me and considered in my medical decision making (see chart for details).   Unclear etiology for progressively worsening weakness. No e/o cardiac or pulmonary causes. Full workup already done previously by Rheum that was negative. Will dc to fu w/ PCP for further workup of same.   Final Clinical Impressions(s) / ED Diagnoses   Final diagnoses:  Weakness   Fatigue, unspecified type    ED Discharge Orders    None       Darrol Brandenburg, Corene Cornea, MD 09/15/18 [phone]

## 2018-09-16 ENCOUNTER — Encounter (HOSPITAL_COMMUNITY): Payer: Self-pay

## 2018-09-16 ENCOUNTER — Ambulatory Visit (HOSPITAL_COMMUNITY): Admission: RE | Admit: 2018-09-16 | Payer: No Typology Code available for payment source | Source: Ambulatory Visit

## 2018-09-17 ENCOUNTER — Other Ambulatory Visit: Payer: Self-pay

## 2018-09-17 ENCOUNTER — Encounter: Payer: Self-pay | Admitting: Family Medicine

## 2018-09-17 ENCOUNTER — Ambulatory Visit (INDEPENDENT_AMBULATORY_CARE_PROVIDER_SITE_OTHER): Payer: No Typology Code available for payment source | Admitting: Family Medicine

## 2018-09-17 VITALS — BP 110/71 | HR 78 | Temp 97.8°F | Resp 16 | Ht 64.0 in | Wt 165.2 lb

## 2018-09-17 DIAGNOSIS — M791 Myalgia, unspecified site: Secondary | ICD-10-CM

## 2018-09-17 DIAGNOSIS — R5382 Chronic fatigue, unspecified: Secondary | ICD-10-CM | POA: Diagnosis not present

## 2018-09-17 DIAGNOSIS — G9332 Myalgic encephalomyelitis/chronic fatigue syndrome: Secondary | ICD-10-CM

## 2018-09-17 DIAGNOSIS — L52 Erythema nodosum: Secondary | ICD-10-CM

## 2018-09-17 DIAGNOSIS — R5383 Other fatigue: Secondary | ICD-10-CM | POA: Diagnosis not present

## 2018-09-17 DIAGNOSIS — F329 Major depressive disorder, single episode, unspecified: Secondary | ICD-10-CM

## 2018-09-17 DIAGNOSIS — F450 Somatization disorder: Secondary | ICD-10-CM

## 2018-09-17 MED ORDER — MELOXICAM 15 MG PO TABS: 15.0000 mg | ORAL_TABLET | Freq: Every day | ORAL | 1 refills | Status: DC

## 2018-09-17 MED ORDER — NORTRIPTYLINE HCL 75 MG PO CAPS: 75.0000 mg | ORAL_CAPSULE | Freq: Every day | ORAL | 1 refills | Status: DC

## 2018-09-17 MED FILL — NORTRIPTYLINE HCL 75 MG CAP: 75 | 90 days supply | Qty: 90 | Fill #0

## 2018-09-17 MED FILL — MELOXICAM 15 MG TABLET: 15 | 90 days supply | Qty: 90 | Fill #0

## 2018-09-17 NOTE — Progress Notes (Signed)
Bodhi Stenglein , 06-10-1970, 48 y.o., female MRN: [id number] Patient Care Team    Relationship Specialty Notifications Start End  Ma Hillock, DO PCP - General Family Medicine  03/12/17   Gerda Diss, DO Consulting Physician Sports Medicine  02/21/18   Gavin Pound, MD Consulting Physician Rheumatology  07/23/18   Linda Hedges, [place] Physician Obstetrics and Gynecology  07/23/18     Chief Complaint  Patient presents with  . Muscle Pain    Pt continues to have muscle aches, pain, weakness  . Fatigue    Fatigue continues and is not getting any better. She does not know what to do or where to go from here.      Subjective: Raelan Burgoon is a 48 y.o. female present for ED follow up on her chronic fatigue, abdominal bloating and myalgia.  Patient reports she was seen in emergency room because her muscle pain became extremely worse.  She also had complained of some abdominal discomfort and a CT was completed which showed no uterine fibroids, which she is scheduled for a hysterectomy with her gynecologist. She reports she is extremely frustrated with her fatigue, increased body weight, muscle discomfort.  She knows she has depression and anxiety, and is aware of possible somatic manifestations of this, but just does not feel that is the only cause.  She has been evaluated by gastroenterology, gynecology, dermatology, rheumatology and sports med.    Depression screen [facility] 2/9 04/08/2018 06/20/2017 03/12/2017 03/12/2017  Decreased Interest '2 2 1 ' 0  Down, Depressed, Hopeless 2 2 0 0  PHQ - 2 Score '4 4 1 ' 0  Altered sleeping 2 2 0 -  Tired, decreased energy '3 2 1 ' -  Change in appetite '2 2 1 ' -  Feeling bad or failure about yourself  0 1 0 -  Trouble concentrating 1 0 0 -  Moving slowly or fidgety/restless 0 0 0 -  Suicidal thoughts 0 0 0 -  PHQ-9 Score '12 11 3 ' -  Difficult doing work/chores Somewhat difficult - Not difficult at all -    Allergies  Allergen Reactions  .  Molds & Smuts Shortness Of Breath  . Effexor [Venlafaxine] Nausea Only   Social History   Social History Narrative   Single. Bachelor's degree. Works as a traveling Therapist, sports.   Wears a bicycle helmet.   Vegetarian diet.   Drinks caffeine.   Smoke alarm in the home. Wears her seatbelt.   Feels safe in her relationships.   Past Medical History:  Diagnosis Date  . Arthritis    Neck, left ankle, clavicle  . Chickenpox 1987  . Chronic constipation   . Costochondritis   . Depression with somatization   . Erythema nodosum   . Fibroid uterus   . Gastritis 2018  . GERD (gastroesophageal reflux disease)   . Hemorrhoid   . History of PCR DNA positive for HSV1    buttocks area  . Psoriasis   . Rosacea   . Stomach ulcer 2007   Past Surgical History:  Procedure Laterality Date  . COLONOSCOPY WITH ESOPHAGOGASTRODUODENOSCOPY (EGD)  2007, 2008   Patient reports colonoscopy was unsuccessful secondary to her not being able to tolerate prep. Was being completed for chronic constipation.  . ESOPHAGOGASTRODUODENOSCOPY  06/28/2016   gastritis; gastric antrum  . SALPINGECTOMY Right 2004   ectopic pregnancy  . TONSILLECTOMY  2004   Family History  Problem Relation Age of Onset  . Gout Mother   .  Rheum arthritis Mother   . Osteoarthritis Mother   . Breast cancer Mother   . Depression Mother   . Coronary artery disease Mother   . Hyperlipidemia Mother   . Hypertension Mother   . Kidney disease Mother   . Alcohol abuse Father   . Arthritis Father   . Hearing loss Father   . Hyperlipidemia Father   . Hypertension Father   . Psoriasis Father   . Arthritis Brother   . Hyperlipidemia Brother   . Hypertension Brother   . Psoriasis Brother   . Breast cancer Maternal Grandmother   . Heart murmur Maternal Grandmother   . Breast cancer Paternal Grandmother   . Psoriasis Paternal Grandmother   . Dementia Paternal Grandmother   . Heart attack Paternal Grandfather   . Stroke Paternal  Grandfather   . Coronary artery disease Paternal Grandfather    Allergies as of 09/17/2018      Reactions   Molds & Smuts Shortness Of Breath   Effexor [venlafaxine] Nausea Only      Medication List       Accurate as of September 17, 2018 11:59 PM. If you have any questions, ask your nurse or doctor.        STOP taking these medications   ibuprofen 200 MG tablet Commonly known as: ADVIL Stopped by: Howard Pouch, DO     TAKE these medications   B-complex with vitamin C tablet Take 1 tablet by mouth daily.   cholecalciferol 25 MCG (1000 UT) tablet Commonly known as: VITAMIN D3 Take 1,000 Units by mouth daily.   linaclotide 145 MCG Caps capsule Commonly known as: LINZESS Take 145 mcg by mouth daily before breakfast.   meloxicam 15 MG tablet Commonly known as: MOBIC Take 1 tablet (15 mg total) by mouth daily. Started by: Howard Pouch, DO   nortriptyline 75 MG capsule Commonly known as: PAMELOR Take 1 capsule (75 mg total) by mouth at bedtime. What changed:   medication strength  how much to take  how to take this  when to take this  additional instructions Changed by: Howard Pouch, DO   tretinoin 0.05 % cream Commonly known as: RETIN-A Apply 1 application topically at bedtime as needed.       All past medical history, surgical history, allergies, family history, immunizations andmedications were updated in the EMR today and reviewed under the history and medication portions of their EMR.     ROS: Negative, with the exception of above mentioned in HPI  Objective:  BP 110/71 (BP Location: Right Arm, Patient Position: Sitting, Cuff Size: Normal)   Pulse 78   Temp 97.8 F (36.6 C) (Temporal)   Resp 16   Ht '5\' 4"'  (1.626 m)   Wt 165 lb 4 oz (75 kg)   SpO2 100%   BMI 28.37 kg/m  Body mass index is 28.37 kg/m. Gen: Afebrile. No acute distress.  Nontoxic in appearance.  Well-developed, well-nourished.  Overweight Caucasian female. HENT: AT. [place].  MMM.   Eyes:Pupils Equal Round Reactive to light, Extraocular movements intact,  Conjunctiva without redness, discharge or icterus. Neck/lymp/endocrine: Supple, no lymphadenopathy, no thyromegaly CV: RRR no murmur, no edema Chest: CTAB, no wheeze or crackles Abd: Soft. ND. BS present. Skin: no rashes, purpura or petechiae.  Neuro:  Normal gait. PERLA. EOMi. Alert. Oriented x3  Psych: Normal affect, dress and demeanor. Normal speech. Normal thought content and judgment.  No exam data present No results found. No results found for this or any  previous visit (from the past 24 hour(s)).  Assessment/Plan: Alie Moudy is a 48 y.o. female present for OV for  Myalgia/Fatigue due to depression -She is under the care of multiple specialists for her symptoms which are felt to be related to chronic fatigue syndrome/fibromyalgia  - abdominal discomfort secondary to her chronic IBS and possibly uterine fibroids playing a role.    -She has a hysterectomy scheduled with her gynecologist.    -She has been tried on [place]. Recommend she restart Linzess.  - Trial of mobic. Voltaren was not helpful.  - continue OTC  vit d and b12 supplementation .  - increase nortriptyline to 75 mg nightly. -Patient has been evaluated by cardiology for dizziness, tachycardia and palpitations.  No cardiac etiology found. -For her depression/anxiety she was on Wellbutrin and Effexor.  She tapered off Effexor thinking it was causing her dizziness.  It was not.  The dizziness remained after the Effexor was tapered off.  She is also no longer taking Wellbutrin. -She has a history of psoriasis erythema nodosum.  Refer to dermatology and rheumatology to see if playing a role and her discomfort.  Both specialties did not feel symptoms coming from a dermatological or rheumatological condition. -Autoimmune work-up was negative.   - Normal ESR, CRP, ANA and reflex panel, rheumatoid factor, CCP, Lyme titers, CK, CMP, CBC,  vitamin D, B12,, TSH, T4, A1c. - Referral to psychiatry placed today-she does have rather severe depression symptoms and her symptoms very well could be at least partially psychosomatic. -Cannot rule out infectious causes for fatigue and myalgia, will obtain lab work ruling out infectious diseases today.  Consider ID referral. - refer to neurology placed>> further eval/r/o  neuromsk disorder not already considered.   Reviewed expectations re: course of current medical issues.  Discussed self-management of symptoms.  Outlined signs and symptoms indicating need for more acute intervention.  Patient verbalized understanding and all questions were answered.  Patient received an After-Visit Summary.    Orders Placed This Encounter  Procedures  . CMV abs, IgG+IgM (cytomegalovirus)  . Epstein-Barr virus VCA antibody panel  . [place] spotted fvr abs pnl(IgG+IgM)  . Hepatitis, Acute  . HIV antibody (with reflex)  . EXTRA LAV TOP TUBE  . Antistreptolysin O titer  . Antistreptolysin O titer  . TEST AUTHORIZATION  . Ambulatory referral to Psychiatry  . Ambulatory referral to Neurology    > 25 minutes spent with patient, >50% of time spent face to face    Note is dictated utilizing voice recognition software. Although note has been proof read prior to signing, occasional typographical errors still can be missed. If any questions arise, please do not hesitate to call for verification.   electronically signed by:  Howard Pouch, DO  [place]

## 2018-09-17 NOTE — Patient Instructions (Signed)
Increase amitriptyline to 75 mg total before bed. Start Mobic one tab daily.  I will refer you to psychiatry and ID.    Fatigue If you have fatigue, you feel tired all the time and have a lack of energy or a lack of motivation. Fatigue may make it difficult to start or complete tasks because of exhaustion. In general, occasional or mild fatigue is often a normal response to activity or life. However, long-lasting (chronic) or extreme fatigue may be a symptom of a medical condition. Follow these instructions at home: General instructions  Watch your fatigue for any changes.  Go to bed and get up at the same time every day.  Avoid fatigue by pacing yourself during the day and getting enough sleep at night.  Maintain a healthy weight. Medicines  Take over-the-counter and prescription medicines only as told by your health care provider.  Take a multivitamin, if told by your health care provider.  Do not use herbal or dietary supplements unless they are approved by your health care provider. Activity   Exercise regularly, as told by your health care provider.  Use or practice techniques to help you relax, such as yoga, tai chi, meditation, or massage therapy. Eating and drinking   Avoid heavy meals in the evening.  Eat a well-balanced diet, which includes lean proteins, whole grains, plenty of fruits and vegetables, and low-fat dairy products.  Avoid consuming too much caffeine.  Avoid the use of alcohol.  Drink enough fluid to keep your urine pale yellow. Lifestyle  Change situations that cause you stress. Try to keep your work and personal schedule in balance.  Do not use any products that contain nicotine or tobacco, such as cigarettes and e-cigarettes. If you need help quitting, ask your health care provider.  Do not use drugs. Contact a health care provider if:  Your fatigue does not get better.  You have a fever.  You suddenly lose or gain weight.  You have  headaches.  You have trouble falling asleep or sleeping through the night.  You feel angry, guilty, anxious, or sad.  You are unable to have a bowel movement (constipation).  Your skin is dry.  You have swelling in your legs or another part of your body. Get help right away if:  You feel confused.  Your vision is blurry.  You feel faint or you pass out.  You have a severe headache.  You have severe pain in your abdomen, your back, or the area between your waist and hips (pelvis).  You have chest pain, shortness of breath, or an irregular or fast heartbeat.  You are unable to urinate, or you urinate less than normal.  You have abnormal bleeding, such as bleeding from the rectum, vagina, nose, lungs, or nipples.  You vomit blood.  You have thoughts about hurting yourself or others. If you ever feel like you may hurt yourself or others, or have thoughts about taking your own life, get help right away. You can go to your nearest emergency department or call:  Your local emergency services (911 in the [place]  A suicide crisis helpline, such as the [place] [facility] at (912) 644-7390. This is open 24 hours a day. Summary  If you have fatigue, you feel tired all the time and have a lack of energy or a lack of motivation.  Fatigue may make it difficult to start or complete tasks because of exhaustion.  Long-lasting (chronic) or extreme fatigue may be a symptom  of a medical condition.  Exercise regularly, as told by your health care provider.  Change situations that cause you stress. Try to keep your work and personal schedule in balance. This information is not intended to replace advice given to you by your health care provider. Make sure you discuss any questions you have with your health care provider. Document Released: 12/03/2006 Document Revised: 05/29/2018 Document Reviewed: 10/31/2016 Elsevier Patient Education  2020 [employer].

## 2018-09-19 ENCOUNTER — Ambulatory Visit (INDEPENDENT_AMBULATORY_CARE_PROVIDER_SITE_OTHER): Payer: No Typology Code available for payment source | Admitting: Clinical

## 2018-09-19 DIAGNOSIS — F332 Major depressive disorder, recurrent severe without psychotic features: Secondary | ICD-10-CM | POA: Diagnosis not present

## 2018-09-19 LAB — HEPATITIS PANEL, ACUTE
Hep A IgM: NONREACTIVE
Hep B C IgM: NONREACTIVE
Hepatitis B Surface Ag: NONREACTIVE
Hepatitis C Ab: NONREACTIVE
SIGNAL TO CUT-OFF: 0.01 (ref <1.00)

## 2018-09-19 LAB — EPSTEIN-BARR VIRUS VCA ANTIBODY PANEL
EBV NA IgG: >600 U/mL — ABNORMAL HIGH
EBV VCA IgG: 324 U/mL — ABNORMAL HIGH
EBV VCA IgM: <36 U/mL

## 2018-09-19 LAB — TEST AUTHORIZATION

## 2018-09-19 LAB — ANTISTREPTOLYSIN O TITER: ASO: 88 IU/mL (ref <200)

## 2018-09-19 LAB — ROCKY MTN SPOTTED FVR ABS PNL(IGG+IGM)
RMSF IgG: NOT DETECTED
RMSF IgM: NOT DETECTED

## 2018-09-19 LAB — CMV ABS, IGG+IGM (CYTOMEGALOVIRUS)
CMV IgM: <30 AU/mL
Cytomegalovirus Ab-IgG: <0.6 U/mL

## 2018-09-19 LAB — EXTRA LAV TOP TUBE

## 2018-09-19 LAB — HIV ANTIBODY (ROUTINE TESTING W REFLEX): HIV 1&2 Ab, 4th Generation: NONREACTIVE

## 2018-09-19 NOTE — H&P (Signed)
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   Pertinent Gynecological History: Menses: flow is excessive with use of 10 pads or tampons on heaviest days Bleeding: dysfunctional uterine bleeding Contraception: vasectomy DES exposure: denies Blood transfusions: none Sexually transmitted diseases: no past history Previous GYN Procedures: N/A  Last mammogram: normal Date: 2019 (pt report) Last pap: normal Date: 2020 OB History: G1, P0010 (ectopic w/salpingectomy)   Menstrual History: No LMP recorded. Patient has had an implant.    Past Medical History:  Diagnosis Date  . Arthritis    Neck, left ankle, clavicle  . Chickenpox 1987  . Chronic constipation   . Costochondritis   . Depression with somatization   . Erythema nodosum   . Fibroid uterus   . Gastritis 2018  . GERD (gastroesophageal reflux disease)   . Hemorrhoid   . History of PCR DNA positive for HSV1    buttocks area  . Psoriasis   . Rosacea   . Stomach ulcer 2007    Past Surgical History:  Procedure Laterality Date  . COLONOSCOPY WITH ESOPHAGOGASTRODUODENOSCOPY (EGD)  2007, 2008   Patient reports colonoscopy was unsuccessful secondary to her not being able to tolerate prep. Was being completed for chronic constipation.  . ESOPHAGOGASTRODUODENOSCOPY  06/28/2016   gastritis; gastric antrum  . SALPINGECTOMY Right 2004   ectopic pregnancy  . TONSILLECTOMY  2004    Family History  Problem Relation Age of Onset  . Gout Mother   . Rheum arthritis Mother   . Osteoarthritis Mother   . Breast cancer Mother   . Depression Mother   . Coronary artery disease Mother   . Hyperlipidemia Mother   . Hypertension Mother   . Kidney disease Mother   . Alcohol abuse Father   . Arthritis Father   . Hearing loss Father   . Hyperlipidemia Father   . Hypertension Father   . Psoriasis Father   . Arthritis Brother   .  Hyperlipidemia Brother   . Hypertension Brother   . Psoriasis Brother   . Breast cancer Maternal Grandmother   . Heart murmur Maternal Grandmother   . Breast cancer Paternal Grandmother   . Psoriasis Paternal Grandmother   . Dementia Paternal Grandmother   . Heart attack Paternal Grandfather   . Stroke Paternal Grandfather   . Coronary artery disease Paternal Grandfather     Social History:  reports that she has never smoked. She has never used smokeless tobacco. She reports current drug use. She reports that she does not drink alcohol.  Allergies:  Allergies  Allergen Reactions  . Molds & Smuts Shortness Of Breath  . Effexor [Venlafaxine] Nausea Only    No medications prior to admission.    ROS  There were no vitals taken for this visit. Physical Exam  Gen: well appearing, NAD CV: Reg rate Pulm: NWOB Abd: soft, nondistended, nontender, no masses, prior pfannenstiel incision well healted GYN: uterus 12 week size, no adnexa ttp/CMT Ext: No edema b/l   No results found for this or any previous visit (from the past 24 hour(s)).  No results found. [place] 2020: Fibroids, all <4cm, intramural and subserosal, uterus enlarged to 11cm.  Assessment/Plan: 48 yo G1P0 (hx open ectopic salpingectomy) presenting for scheduled TAH with salpingectomy (possible bilateral if portion of other tube remains) and possible cystoscopy. She was counseled in the office regarding alternatives and desires definitive management.  Risks discussed including infection, bleeding, damage to surrounding structures, need for additional procedures, postoperative DVT and future prolapse. All questions answered. Consent signed in the office. 2g ancef on call to OR.    Tyson Dense 09/19/2018, 2:41 PM

## 2018-09-22 ENCOUNTER — Encounter: Payer: Self-pay | Admitting: Family Medicine

## 2018-09-22 MED FILL — AZELAIC ACID 15 % GEL: 15 | 30 days supply | Qty: 50 | Fill #1

## 2018-09-22 MED FILL — TRETINOIN 0.05 % CREA: 0.05 | 20 days supply | Qty: 45 | Fill #3

## 2018-09-22 NOTE — Telephone Encounter (Signed)
Pt sent the following my chart message  Dr. Raoul Pitch, Want to thank you for all the patience and kindness you've given me throughout the last year and a half. I'm glad I was referred to you.  You're a very thorough and smart doctor. I know you work very hard for your patients, and I as one of them feel it.    I was let go from my job today, due to repeat instances of calling out not well and ER visits.  [facility] has a no fault attendance policy and in the past 6 months I've missed so many days of work.  I'm telling you this, as the reason I won't be a patient any longer.  My health benefits will expire at the end of this month, unless something unforeseen happens.  Because of this I'll probably be covered in the future by another carrier.    I wish you all the best personally and professionally.  Pleas thank your staff for me and Diane at the [employer]- all wonderful!!!!

## 2018-09-26 ENCOUNTER — Ambulatory Visit (INDEPENDENT_AMBULATORY_CARE_PROVIDER_SITE_OTHER): Payer: No Typology Code available for payment source | Admitting: Clinical

## 2018-09-26 DIAGNOSIS — F332 Major depressive disorder, recurrent severe without psychotic features: Secondary | ICD-10-CM | POA: Diagnosis not present

## 2018-10-02 NOTE — Pre-Procedure Instructions (Signed)
Cariana Karge  10/02/2018      [place], [place] - [address] [place] [address] Phone: [phone] Fax: [phone]  [facility] [place], [place] [address] Phone: [phone] Fax: [phone]    Your procedure is scheduled on October 07, 2018.  Report to [facility] Admitting at 530 AM.  Call this number if you have problems the morning of surgery:  [phone]   Remember:  Do not eat after midnight.  You may drink clear liquids until 430 AM the morning of surgery.  Clear liquids allowed are:    Water, Juice (non-citric and without pulp), Clear Tea, Black Coffee only and Gatorade    Take these medicines the morning of surgery with A SIP OF WATER  Bupropion (Wellbutrin) Eye drops-if needed Linzess-if needed  Beginning now, STOP taking any Aspirin (unless otherwise instructed by your surgeon), Aleve, Naproxen, Ibuprofen, Motrin, Advil, Goody's, BC's, all herbal medications, fish oil, and all vitamins    Day of surgery:  Do not wear jewelry, make-up or nail polish.  Do not wear lotions, powders, or perfumes, or deodorant.  Do not shave 48 hours prior to surgery.    Do not bring valuables to the hospital.  [facility] is not responsible for any belongings or valuables.  IF you are a smoker, DO NOT Smoke 24 hours prior to surgery   IF you wear a CPAP at night please bring your mask, tubing, and machine the morning of surgery    Remember that you must have someone to transport you home after your surgery, and remain with you for 24 hours if you are discharged the same day.  Contacts, dentures or bridgework may not be worn into surgery.  Leave your suitcase in the car.  After surgery it may be brought to your room.  For patients admitted to the hospital, discharge time will be determined by your treatment team.  Patients  discharged the day of surgery will not be allowed to drive home.    [facility]- Preparing For Surgery  Before surgery, you can play an important role. Because skin is not sterile, your skin needs to be as free of germs as possible. You can reduce the number of germs on your skin by washing with CHG (chlorahexidine gluconate) Soap before surgery.  CHG is an antiseptic cleaner which kills germs and bonds with the skin to continue killing germs even after washing.    Oral Hygiene is also important to reduce your risk of infection.  Remember - BRUSH YOUR TEETH THE MORNING OF SURGERY WITH YOUR REGULAR TOOTHPASTE  Please do not use if you have an allergy to CHG or antibacterial soaps. If your skin becomes reddened/irritated stop using the CHG.  Do not shave (including legs and underarms) for at least 48 hours prior to first CHG shower. It is OK to shave your face.  Please follow these instructions carefully.   1. Shower the NIGHT BEFORE SURGERY and the MORNING OF SURGERY with CHG.   2. If you chose to wash your hair, wash your hair first as usual with your normal shampoo.  3. After you shampoo, rinse your hair and body thoroughly to remove the shampoo.  4. Use CHG as you would any other liquid soap. You can apply CHG directly to the skin and wash gently with a scrungie or a clean  washcloth.   5. Apply the CHG Soap to your body ONLY FROM THE NECK DOWN.  Do not use on open wounds or open sores. Avoid contact with your eyes, ears, mouth and genitals (private parts). Wash Face and genitals (private parts)  with your normal soap.  6. Wash thoroughly, paying special attention to the area where your surgery will be performed.  7. Thoroughly rinse your body with warm water from the neck down.  8. DO NOT shower/wash with your normal soap after using and rinsing off the CHG Soap.  9. Pat yourself dry with a CLEAN TOWEL.  10. Wear CLEAN PAJAMAS to bed the night before surgery, wear comfortable  clothes the morning of surgery  11. Place CLEAN SHEETS on your bed the night of your first shower and DO NOT SLEEP WITH PETS.  Day of Surgery: Shower as above Do not apply any deodorants/lotions.  Please wear clean clothes to the hospital/surgery center.   Remember to brush your teeth WITH YOUR REGULAR TOOTHPASTE.  Please read over the following fact sheets that you were given.

## 2018-10-03 ENCOUNTER — Other Ambulatory Visit (HOSPITAL_COMMUNITY)
Admission: RE | Admit: 2018-10-03 | Discharge: 2018-10-03 | Disposition: A | Discharge status: Home / Self Care | Payer: No Typology Code available for payment source | Source: Ambulatory Visit | Attending: Obstetrics and Gynecology | Admitting: Obstetrics and Gynecology

## 2018-10-03 ENCOUNTER — Inpatient Hospital Stay (HOSPITAL_COMMUNITY)
Admission: RE | Admit: 2018-10-03 | Discharge: 2018-10-03 | Disposition: A | Discharge status: Home / Self Care | Payer: No Typology Code available for payment source | Source: Ambulatory Visit

## 2018-10-03 ENCOUNTER — Ambulatory Visit (INDEPENDENT_AMBULATORY_CARE_PROVIDER_SITE_OTHER): Payer: No Typology Code available for payment source | Admitting: Clinical

## 2018-10-03 DIAGNOSIS — Z01812 Encounter for preprocedural laboratory examination: Secondary | ICD-10-CM | POA: Insufficient documentation

## 2018-10-03 DIAGNOSIS — F332 Major depressive disorder, recurrent severe without psychotic features: Secondary | ICD-10-CM | POA: Diagnosis not present

## 2018-10-03 DIAGNOSIS — Z20828 Contact with and (suspected) exposure to other viral communicable diseases: Secondary | ICD-10-CM | POA: Insufficient documentation

## 2018-10-03 LAB — SARS CORONAVIRUS 2 (TAT 6-24 HRS): SARS Coronavirus 2: NEGATIVE

## 2018-10-03 NOTE — Progress Notes (Signed)
Call attempted, LVM.  Pt no show for PAT appt.

## 2018-10-06 ENCOUNTER — Other Ambulatory Visit: Payer: Self-pay | Admitting: *Deleted

## 2018-10-06 ENCOUNTER — Other Ambulatory Visit: Payer: Self-pay

## 2018-10-06 ENCOUNTER — Encounter (HOSPITAL_COMMUNITY): Payer: Self-pay

## 2018-10-06 ENCOUNTER — Encounter (HOSPITAL_COMMUNITY)
Admission: RE | Admit: 2018-10-06 | Discharge: 2018-10-06 | Disposition: A | Discharge status: Home / Self Care | Payer: No Typology Code available for payment source | Source: Ambulatory Visit | Attending: Obstetrics and Gynecology | Admitting: Obstetrics and Gynecology

## 2018-10-06 DIAGNOSIS — Z01812 Encounter for preprocedural laboratory examination: Secondary | ICD-10-CM | POA: Insufficient documentation

## 2018-10-06 HISTORY — DX: Other complications of anesthesia, initial encounter: T88.59XA

## 2018-10-06 HISTORY — DX: Other specified postprocedural states: R11.2

## 2018-10-06 HISTORY — DX: Other specified postprocedural states: Z98.890

## 2018-10-06 LAB — TYPE AND SCREEN
ABO/RH(D): A POS
Antibody Screen: NEGATIVE

## 2018-10-06 LAB — CBC
HCT: 42.7 % (ref 36.0–46.0)
Hemoglobin: 13.9 g/dL (ref 12.0–15.0)
MCH: 29.1 pg (ref 26.0–34.0)
MCHC: 32.6 g/dL (ref 30.0–36.0)
MCV: 89.5 fL (ref 80.0–100.0)
Platelets: 196 10*3/uL (ref 150–400)
RBC: 4.77 MIL/uL (ref 3.87–5.11)
RDW: 11.9 % (ref 11.5–15.5)
WBC: 6 10*3/uL (ref 4.0–10.5)
nRBC: 0 % (ref 0.0–0.2)

## 2018-10-06 LAB — ABO/RH: ABO/RH(D): A POS

## 2018-10-06 LAB — CBC AND DIFFERENTIAL: WBC: 6

## 2018-10-06 NOTE — Patient Outreach (Signed)
Mary [name]) Care Management  10/06/2018  Mary [name] 1970-02-24 [id number]  Preoperative Screening Call Referral received: 10/02/18 Surgery/Procedure date:  10/07/18 Initial outreach: 10/06/18 Insurance: [employer]  Subjective:  Initial successful telephone call to patient's mobile number in order to complete preoperative screening. 2 HIPAA identifiers verified. Discussed purpose of preoperative call. Patient voices understanding and agrees to call. 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 She understands the reason for the surgery and the expected time of arrival. She says she completed her preadmission testing earlier today and has no additional questions. She says she expects to be in the hospital overnight. She states she did not purchase the hospital indemnity insurance. She says will not have any one in he home care to assist in her recovery but her friend and Mom will check on her via phone. She says she will take an [address] home from the hospital.  She does not have and is not interested in completing advanced directives at this time. She agrees to a post hospital discharge transition of care call.    Objective: Per the electronic medical record, Mary [name] is scheduled for hysterectomy, and bilateral salpingoophorectomy  on 10/07/18 at [facility].  She completed her pre-operative admission testing visit on 10/06/18. Comorbidities include: GERD, IBS, stomach ulcer, chronic fatigue syndrome, rosacea, B12 deficiency, depression, uterine fibroids, vit D deficiency, myalgia  Assessment: No in home care to assist in her recovery so discussed the importance of having her cell phone with her at all times.  Preoperative call completed, no preoperative needs identified.   Plan: RNCM will call patient for post hospital  transition of care outreach within 72 hours of hospital discharge notification.  Barrington Ellison RN,CCM,CDE [place] Management Coordinator Office Phone [phone] Office Fax [phone]

## 2018-10-06 NOTE — Progress Notes (Addendum)
PCP - Howard Pouch, DO Cardiologist - Dr. Quay Burow- holter Monitor in 06/2018  Chest x-ray - pt denies EKG - 09/15/2018  Stress Test - pt denies past 5 years ECHO - pt denies  Cardiac Cath - pt denies  Sleep Study - pt denies CPAP - n/a  Fasting Blood Sugar - n/a Checks Blood Sugar _____ times a day-n/a  Blood Thinner Instructions: n/a Aspirin Instructions: n/a  Anesthesia review: Yes, EKG-patient wore holter monitor a few months ago because the patient felt her heart was racing at [employer]. Dr. Gwenlyn Found said no further work up needed per patient. Results in EPIC  Patient denies shortness of breath, fever, cough and chest pain at PAT appointment  Patient verbalized understanding of instructions that were given to them at the PAT appointment. Patient was also instructed that they will need to review over the PAT instructions again at home before surgery.   Coronavirus Screening  Have you experienced the following symptoms:  Cough yes/no: No Fever (>100.66F)  yes/no: No Runny nose yes/no: No Sore throat yes/no: No Difficulty breathing/shortness of breath  yes/no: No  Have you or a family member traveled in the last 14 days and where? yes/no: No   If the patient indicates "YES" to the above questions, their PAT will be rescheduled to limit the exposure to others and, the surgeon will be notified. THE PATIENT WILL NEED TO BE ASYMPTOMATIC FOR 14 DAYS.   If the patient is not experiencing any of these symptoms, the PAT nurse will instruct them to NOT bring anyone with them to their appointment since they may have these symptoms or traveled as well.   Please remind your patients and families that hospital visitation restrictions are in effect and the importance of the restrictions.

## 2018-10-06 NOTE — Anesthesia Preprocedure Evaluation (Addendum)
Anesthesia Evaluation  Patient identified by MRN, date of birth, ID band Patient awake    Reviewed: Allergy & Precautions, H&P , NPO status , Patient's Chart, lab work & pertinent test results  History of Anesthesia Complications (+) PONV and history of anesthetic complications  Airway Mallampati: II   Neck ROM: full    Dental  (+) Teeth Intact, Dental Advidsory Given   Pulmonary neg pulmonary ROS,    breath sounds clear to auscultation       Cardiovascular negative cardio ROS   Rhythm:regular Rate:Normal     Neuro/Psych PSYCHIATRIC DISORDERS Anxiety Depression    GI/Hepatic PUD, GERD  ,  Endo/Other    Renal/GU      Musculoskeletal  (+) Arthritis ,   Abdominal   Peds  Hematology   Anesthesia Other Findings Braces upper and lower.  Reproductive/Obstetrics                            Anesthesia Physical Anesthesia Plan  ASA: II  Anesthesia Plan: General   Post-op Pain Management:    Induction: Intravenous  PONV Risk Score and Plan: 4 or greater and Ondansetron, Dexamethasone, Midazolam, Scopolamine patch - Pre-op and Treatment may vary due to age or medical condition  Airway Management Planned: Oral ETT  Additional Equipment:   Intra-op Plan:   Post-operative Plan: Extubation in OR  Informed Consent: I have reviewed the patients History and Physical, chart, labs and discussed the procedure including the risks, benefits and alternatives for the proposed anesthesia with the patient or authorized representative who has indicated his/her understanding and acceptance.     Dental Advisory Given  Plan Discussed with: CRNA, Anesthesiologist and Surgeon  Anesthesia Plan Comments: (Recently evaluated by cardiology for self reported tachy palpitations. She wore 2 week holter monitor that showed some PACs and PVCs but no significant dysrhythmias.   ZioPatch 07/24/18: 1. Normal sinus  rhythm/sinus tachycardia  2. Occasional PACs and PVCs)      Anesthesia Quick Evaluation

## 2018-10-07 ENCOUNTER — Encounter (HOSPITAL_COMMUNITY): Payer: Self-pay | Admitting: Anesthesiology

## 2018-10-07 ENCOUNTER — Inpatient Hospital Stay (HOSPITAL_COMMUNITY): Payer: No Typology Code available for payment source | Admitting: Physician Assistant

## 2018-10-07 ENCOUNTER — Inpatient Hospital Stay (HOSPITAL_COMMUNITY)
Admission: RE | Admit: 2018-10-07 | Discharge: 2018-10-08 | DRG: 743 | Disposition: A | Discharge status: Home / Self Care | Payer: No Typology Code available for payment source | Attending: Obstetrics and Gynecology | Admitting: Obstetrics and Gynecology

## 2018-10-07 ENCOUNTER — Encounter (HOSPITAL_COMMUNITY)
Admission: RE | Disposition: A | Discharge status: Home / Self Care | Payer: Self-pay | Source: Home / Self Care | Attending: Obstetrics and Gynecology

## 2018-10-07 ENCOUNTER — Other Ambulatory Visit: Payer: Self-pay

## 2018-10-07 ENCOUNTER — Inpatient Hospital Stay (HOSPITAL_COMMUNITY): Payer: No Typology Code available for payment source | Admitting: Anesthesiology

## 2018-10-07 DIAGNOSIS — Z8261 Family history of arthritis: Secondary | ICD-10-CM | POA: Diagnosis not present

## 2018-10-07 DIAGNOSIS — M19072 Primary osteoarthritis, left ankle and foot: Secondary | ICD-10-CM | POA: Diagnosis present

## 2018-10-07 DIAGNOSIS — D259 Leiomyoma of uterus, unspecified: Secondary | ICD-10-CM | POA: Diagnosis present

## 2018-10-07 DIAGNOSIS — M4692 Unspecified inflammatory spondylopathy, cervical region: Secondary | ICD-10-CM | POA: Diagnosis present

## 2018-10-07 DIAGNOSIS — Z9079 Acquired absence of other genital organ(s): Secondary | ICD-10-CM

## 2018-10-07 DIAGNOSIS — Z90710 Acquired absence of both cervix and uterus: Secondary | ICD-10-CM | POA: Diagnosis present

## 2018-10-07 DIAGNOSIS — K219 Gastro-esophageal reflux disease without esophagitis: Secondary | ICD-10-CM | POA: Diagnosis present

## 2018-10-07 DIAGNOSIS — Z91048 Other nonmedicinal substance allergy status: Secondary | ICD-10-CM | POA: Diagnosis not present

## 2018-10-07 DIAGNOSIS — Z803 Family history of malignant neoplasm of breast: Secondary | ICD-10-CM | POA: Diagnosis not present

## 2018-10-07 DIAGNOSIS — M1990 Unspecified osteoarthritis, unspecified site: Secondary | ICD-10-CM | POA: Diagnosis present

## 2018-10-07 DIAGNOSIS — N939 Abnormal uterine and vaginal bleeding, unspecified: Secondary | ICD-10-CM | POA: Diagnosis present

## 2018-10-07 DIAGNOSIS — Z8711 Personal history of peptic ulcer disease: Secondary | ICD-10-CM

## 2018-10-07 DIAGNOSIS — Z888 Allergy status to other drugs, medicaments and biological substances status: Secondary | ICD-10-CM

## 2018-10-07 HISTORY — DX: Anxiety disorder, unspecified: F41.9

## 2018-10-07 HISTORY — PX: ABDOMINAL HYSTERECTOMY: SHX81

## 2018-10-07 LAB — POCT PREGNANCY, URINE: Preg Test, Ur: NEGATIVE

## 2018-10-07 SURGERY — HYSTERECTOMY, ABDOMINAL
Anesthesia: General | Laterality: Bilateral

## 2018-10-07 MED ORDER — KETOROLAC TROMETHAMINE 30 MG/ML IJ SOLN
INTRAMUSCULAR | Status: DC | PRN
  Administered 2018-10-07: 30 mg via INTRAVENOUS

## 2018-10-07 MED ORDER — SIMETHICONE 80 MG PO CHEW: 80.0000 mg | CHEWABLE_TABLET | Freq: Four times a day (QID) | ORAL | Status: DC | PRN

## 2018-10-07 MED ORDER — HYDROMORPHONE HCL 1 MG/ML IJ SOLN: 0.2000 mg | INTRAMUSCULAR | Status: DC | PRN

## 2018-10-07 MED ORDER — IBUPROFEN 800 MG PO TABS
800.0000 mg | ORAL_TABLET | Freq: Four times a day (QID) | ORAL | Status: DC
  Administered 2018-10-08: 800 mg via ORAL
  Filled 2018-10-07: qty 1

## 2018-10-07 MED ORDER — PROPOFOL 10 MG/ML IV BOLUS
INTRAVENOUS | Status: DC | PRN
  Administered 2018-10-07: 175 mg via INTRAVENOUS

## 2018-10-07 MED ORDER — OXYCODONE HCL 5 MG PO TABS
5.0000 mg | ORAL_TABLET | ORAL | Status: DC | PRN
  Administered 2018-10-07 – 2018-10-08 (×5): 5 mg via ORAL
  Filled 2018-10-07: qty 1
  Filled 2018-10-07: qty 2
  Filled 2018-10-07 (×3): qty 1

## 2018-10-07 MED ORDER — LINACLOTIDE 145 MCG PO CAPS
145.0000 ug | ORAL_CAPSULE | Freq: Every day | ORAL | Status: DC
  Administered 2018-10-08: 145 ug via ORAL
  Filled 2018-10-07: qty 1

## 2018-10-07 MED ORDER — KETOROLAC TROMETHAMINE 30 MG/ML IJ SOLN
30.0000 mg | Freq: Four times a day (QID) | INTRAMUSCULAR | Status: AC
  Administered 2018-10-07 – 2018-10-08 (×4): 30 mg via INTRAVENOUS
  Filled 2018-10-07 (×4): qty 1

## 2018-10-07 MED ORDER — ONDANSETRON HCL 4 MG PO TABS: 4.0000 mg | ORAL_TABLET | Freq: Four times a day (QID) | ORAL | Status: DC | PRN

## 2018-10-07 MED ORDER — SCOPOLAMINE 1 MG/3DAYS TD PT72
MEDICATED_PATCH | TRANSDERMAL | Status: AC
  Filled 2018-10-07: qty 1

## 2018-10-07 MED ORDER — ONDANSETRON HCL 4 MG/2ML IJ SOLN
INTRAMUSCULAR | Status: AC
  Filled 2018-10-07: qty 4

## 2018-10-07 MED ORDER — CEFAZOLIN SODIUM-DEXTROSE 2-4 GM/100ML-% IV SOLN
2.0000 g | INTRAVENOUS | Status: AC
  Administered 2018-10-07: 2 g via INTRAVENOUS
  Filled 2018-10-07: qty 100

## 2018-10-07 MED ORDER — SUGAMMADEX SODIUM 200 MG/2ML IV SOLN
INTRAVENOUS | Status: DC | PRN
  Administered 2018-10-07: 200 mg via INTRAVENOUS

## 2018-10-07 MED ORDER — ONDANSETRON HCL 4 MG/2ML IJ SOLN
INTRAMUSCULAR | Status: DC | PRN
  Administered 2018-10-07: 4 mg via INTRAVENOUS

## 2018-10-07 MED ORDER — HYPROMELLOSE (GONIOSCOPIC) 2.5 % OP SOLN
1.0000 [drp] | Freq: Three times a day (TID) | OPHTHALMIC | Status: DC | PRN
  Filled 2018-10-07: qty 15

## 2018-10-07 MED ORDER — LACTATED RINGERS IV SOLN
INTRAVENOUS | Status: DC | PRN
  Administered 2018-10-07 (×2): via INTRAVENOUS

## 2018-10-07 MED ORDER — 0.9 % SODIUM CHLORIDE (POUR BTL) OPTIME
TOPICAL | Status: DC | PRN
  Administered 2018-10-07 (×2): 1000 mL

## 2018-10-07 MED ORDER — SODIUM CHLORIDE 0.9 % IV SOLN
INTRAVENOUS | Status: DC | PRN
  Administered 2018-10-07: 08:00:00 10 ug/min via INTRAVENOUS

## 2018-10-07 MED ORDER — MIDAZOLAM HCL 5 MG/5ML IJ SOLN
INTRAMUSCULAR | Status: DC | PRN
  Administered 2018-10-07: 2 mg via INTRAVENOUS

## 2018-10-07 MED ORDER — BUPROPION HCL ER (XL) 150 MG PO TB24
300.0000 mg | ORAL_TABLET | Freq: Every day | ORAL | Status: DC
  Administered 2018-10-07 – 2018-10-08 (×2): 300 mg via ORAL
  Filled 2018-10-07 (×2): qty 2

## 2018-10-07 MED ORDER — OXYCODONE HCL 5 MG/5ML PO SOLN: 5.0000 mg | Freq: Once | ORAL | Status: DC | PRN

## 2018-10-07 MED ORDER — ROCURONIUM BROMIDE 10 MG/ML (PF) SYRINGE
PREFILLED_SYRINGE | INTRAVENOUS | Status: AC
  Filled 2018-10-07: qty 10

## 2018-10-07 MED ORDER — FENTANYL CITRATE (PF) 100 MCG/2ML IJ SOLN
25.0000 ug | INTRAMUSCULAR | Status: DC | PRN
  Administered 2018-10-07 (×2): 25 ug via INTRAVENOUS

## 2018-10-07 MED ORDER — LIDOCAINE IN D5W 4-5 MG/ML-% IV SOLN
INTRAVENOUS | Status: DC | PRN
  Administered 2018-10-07: 15 ug/kg/min via INTRAVENOUS

## 2018-10-07 MED ORDER — FENTANYL CITRATE (PF) 100 MCG/2ML IJ SOLN
INTRAMUSCULAR | Status: DC | PRN
  Administered 2018-10-07 (×2): 25 ug via INTRAVENOUS
  Administered 2018-10-07: 100 ug via INTRAVENOUS
  Administered 2018-10-07 (×2): 25 ug via INTRAVENOUS

## 2018-10-07 MED ORDER — ONDANSETRON HCL 4 MG/2ML IJ SOLN
4.0000 mg | Freq: Four times a day (QID) | INTRAMUSCULAR | Status: AC | PRN
  Administered 2018-10-07: 4 mg via INTRAVENOUS

## 2018-10-07 MED ORDER — FENTANYL CITRATE (PF) 250 MCG/5ML IJ SOLN
INTRAMUSCULAR | Status: AC
  Filled 2018-10-07: qty 5

## 2018-10-07 MED ORDER — DOCUSATE SODIUM 100 MG PO CAPS
100.0000 mg | ORAL_CAPSULE | Freq: Two times a day (BID) | ORAL | Status: DC
  Administered 2018-10-07 – 2018-10-08 (×2): 100 mg via ORAL
  Filled 2018-10-07 (×2): qty 1

## 2018-10-07 MED ORDER — PROPOFOL 500 MG/50ML IV EMUL
INTRAVENOUS | Status: DC | PRN
  Administered 2018-10-07: 20 ug/kg/min via INTRAVENOUS

## 2018-10-07 MED ORDER — DEXAMETHASONE SODIUM PHOSPHATE 10 MG/ML IJ SOLN
INTRAMUSCULAR | Status: AC
  Filled 2018-10-07: qty 1

## 2018-10-07 MED ORDER — ACETAMINOPHEN 500 MG PO TABS
1000.0000 mg | ORAL_TABLET | Freq: Four times a day (QID) | ORAL | Status: DC
  Administered 2018-10-07 – 2018-10-08 (×5): 1000 mg via ORAL
  Filled 2018-10-07 (×5): qty 2

## 2018-10-07 MED ORDER — KETOROLAC TROMETHAMINE 30 MG/ML IJ SOLN
INTRAMUSCULAR | Status: AC
  Filled 2018-10-07: qty 1

## 2018-10-07 MED ORDER — DEXTROSE-NACL 5-0.45 % IV SOLN
INTRAVENOUS | Status: DC
  Administered 2018-10-07: 11:00:00 via INTRAVENOUS

## 2018-10-07 MED ORDER — FENTANYL CITRATE (PF) 100 MCG/2ML IJ SOLN
INTRAMUSCULAR | Status: AC
  Filled 2018-10-07: qty 2

## 2018-10-07 MED ORDER — ONDANSETRON HCL 4 MG/2ML IJ SOLN
INTRAMUSCULAR | Status: AC
  Filled 2018-10-07: qty 2

## 2018-10-07 MED ORDER — ALUM & MAG HYDROXIDE-SIMETH 200-200-20 MG/5ML PO SUSP: 30.0000 mL | ORAL | Status: DC | PRN

## 2018-10-07 MED ORDER — PANTOPRAZOLE SODIUM 40 MG PO TBEC
40.0000 mg | DELAYED_RELEASE_TABLET | Freq: Every day | ORAL | Status: DC
  Administered 2018-10-08: 40 mg via ORAL
  Filled 2018-10-07: qty 1

## 2018-10-07 MED ORDER — MIDAZOLAM HCL 2 MG/2ML IJ SOLN
INTRAMUSCULAR | Status: AC
  Filled 2018-10-07: qty 2

## 2018-10-07 MED ORDER — PROPOFOL 10 MG/ML IV BOLUS
INTRAVENOUS | Status: AC
  Filled 2018-10-07: qty 20

## 2018-10-07 MED ORDER — PROPOFOL 1000 MG/100ML IV EMUL
INTRAVENOUS | Status: AC
  Filled 2018-10-07: qty 100

## 2018-10-07 MED ORDER — OXYCODONE HCL 5 MG PO TABS: 5.0000 mg | ORAL_TABLET | Freq: Once | ORAL | Status: DC | PRN

## 2018-10-07 MED ORDER — ROCURONIUM BROMIDE 50 MG/5ML IV SOSY
PREFILLED_SYRINGE | INTRAVENOUS | Status: DC | PRN
  Administered 2018-10-07: 10 mg via INTRAVENOUS
  Administered 2018-10-07: 50 mg via INTRAVENOUS
  Administered 2018-10-07: 20 mg via INTRAVENOUS

## 2018-10-07 MED ORDER — DEXAMETHASONE SODIUM PHOSPHATE 10 MG/ML IJ SOLN
INTRAMUSCULAR | Status: DC | PRN
  Administered 2018-10-07: 10 mg via INTRAVENOUS

## 2018-10-07 MED ORDER — ONDANSETRON HCL 4 MG/2ML IJ SOLN: 4.0000 mg | Freq: Four times a day (QID) | INTRAMUSCULAR | Status: DC | PRN

## 2018-10-07 MED ORDER — DEXAMETHASONE SODIUM PHOSPHATE 10 MG/ML IJ SOLN
INTRAMUSCULAR | Status: AC
  Filled 2018-10-07: qty 2

## 2018-10-07 MED ORDER — SCOPOLAMINE 1 MG/3DAYS TD PT72
MEDICATED_PATCH | TRANSDERMAL | Status: DC | PRN
  Administered 2018-10-07: 1 via TRANSDERMAL

## 2018-10-07 SURGICAL SUPPLY — 36 items
BENZOIN TINCTURE PRP APPL 2/3 (GAUZE/BANDAGES/DRESSINGS) ×2 IMPLANT
CANISTER SUCT 3000ML PPV (MISCELLANEOUS) ×2 IMPLANT
COVER WAND RF STERILE (DRAPES) ×2 IMPLANT
DERMABOND ADVANCED (GAUZE/BANDAGES/DRESSINGS) ×1
DERMABOND ADVANCED .7 DNX12 (GAUZE/BANDAGES/DRESSINGS) ×1 IMPLANT
DRAPE WARM FLUID 44X44 (DRAPES) ×2 IMPLANT
DRSG OPSITE POSTOP 4X10 (GAUZE/BANDAGES/DRESSINGS) ×2 IMPLANT
DURAPREP 26ML APPLICATOR (WOUND CARE) ×2 IMPLANT
GAUZE 4X4 16PLY RFD (DISPOSABLE) IMPLANT
GLOVE BIO SURGEON STRL SZ 6.5 (GLOVE) ×2 IMPLANT
GLOVE BIOGEL PI IND STRL 6.5 (GLOVE) ×3 IMPLANT
GLOVE BIOGEL PI IND STRL 7.0 (GLOVE) ×2 IMPLANT
GLOVE BIOGEL PI INDICATOR 6.5 (GLOVE) ×3
GLOVE BIOGEL PI INDICATOR 7.0 (GLOVE) ×2
GOWN STRL REUS W/ TWL LRG LVL3 (GOWN DISPOSABLE) ×3 IMPLANT
GOWN STRL REUS W/TWL LRG LVL3 (GOWN DISPOSABLE) ×3
HIBICLENS CHG 4% 4OZ BTL (MISCELLANEOUS) ×2 IMPLANT
KIT TURNOVER KIT B (KITS) ×2 IMPLANT
LIGASURE IMPACT 36 18CM CVD LR (INSTRUMENTS) ×2 IMPLANT
NS IRRIG 1000ML POUR BTL (IV SOLUTION) ×2 IMPLANT
PACK ABDOMINAL GYN (CUSTOM PROCEDURE TRAY) ×2 IMPLANT
PAD OB MATERNITY 4.3X12.25 (PERSONAL CARE ITEMS) ×2 IMPLANT
RTRCTR C-SECT PINK 25CM LRG (MISCELLANEOUS) ×2 IMPLANT
SET CYSTO W/LG BORE CLAMP LF (SET/KITS/TRAYS/PACK) ×2 IMPLANT
SHEET LAVH (DRAPES) ×2 IMPLANT
SPONGE LAP 18X18 RF (DISPOSABLE) IMPLANT
STRIP CLOSURE SKIN 1/2X4 (GAUZE/BANDAGES/DRESSINGS) ×2 IMPLANT
SUT PLAIN 2 0 (SUTURE)
SUT PLAIN ABS 2-0 CT1 27XMFL (SUTURE) IMPLANT
SUT VIC AB 0 CT1 18XCR BRD8 (SUTURE) ×2 IMPLANT
SUT VIC AB 0 CT1 36 (SUTURE) ×8 IMPLANT
SUT VIC AB 0 CT1 8-18 (SUTURE) ×2
SUT VIC AB 4-0 PS2 27 (SUTURE) ×2 IMPLANT
SUT VICRYL 0 TIES 12 18 (SUTURE) ×2 IMPLANT
TOWEL GREEN STERILE FF (TOWEL DISPOSABLE) ×4 IMPLANT
TRAY FOLEY W/BAG SLVR 14FR (SET/KITS/TRAYS/PACK) ×2 IMPLANT

## 2018-10-07 NOTE — Brief Op Note (Signed)
10/07/2018  1:23 PM  PATIENT:  Mary [name]  48 y.o. female  PRE-OPERATIVE DIAGNOSIS:  Fibroids  POST-OPERATIVE DIAGNOSIS:  Fibroids  PROCEDURE:  Procedure(s): HYSTERECTOMY ABDOMINAL, Left SALPINGECTOMY (Bilateral)  SURGEON:  Surgeon(s) and Role:    * Leger, Colin Benton, MD - Primary    * Louretta Shorten, MD - Assisting  PHYSICIAN ASSISTANT:   ASSISTANTS: Louretta Shorten   ANESTHESIA:   general  EBL:  150 mL   BLOOD ADMINISTERED:none  DRAINS: Urinary Catheter (Foley)   LOCAL MEDICATIONS USED:  NONE  SPECIMEN:  Source of Specimen:  uterus with cervix and left fallopian tube  DISPOSITION OF SPECIMEN:  PATHOLOGY  COUNTS:  YES  TOURNIQUET:  * No tourniquets in log *  DICTATION: .Note written in EPIC  PLAN OF CARE: Admit to inpatient   PATIENT DISPOSITION:  PACU - hemodynamically stable.   Delay start of Pharmacological VTE agent (>24hrs) due to surgical blood loss or risk of bleeding: not applicable

## 2018-10-07 NOTE — Progress Notes (Signed)
Day of Surgery Procedure(s) (LRB): HYSTERECTOMY ABDOMINAL, Left SALPINGECTOMY (Bilateral)  Subjective: Patient reports tolerating PO.    Objective: I have reviewed patient's vital signs, intake and output and medications.  General: alert, cooperative and appears stated age Resp: normal percussion bilaterally and NWOB Cardio: regular rate and rhythm GI: soft, non-tender; bowel sounds normal; no masses,  no organomegaly and incision: clean, dry and intact Extremities: extremities normal, atraumatic, no cyanosis or edema Vaginal Bleeding: minimal  Assessment: s/p Procedure(s): HYSTERECTOMY ABDOMINAL, Left SALPINGECTOMY (Bilateral): stable, progressing well and tolerating diet  Plan: Advance diet Encourage ambulation Advance to PO medication Continue foley due to strict I&O - d/c in the AM  LOS: 0 days    Tyson Dense 10/07/2018, 3:50 PM

## 2018-10-07 NOTE — Anesthesia Procedure Notes (Signed)
Procedure Name: Intubation Date/Time: 10/07/2018 7:24 AM Performed by: Neldon Newport, CRNA Pre-anesthesia Checklist: Timeout performed, Patient being monitored, Suction available, Emergency Drugs available and Patient identified Patient Re-evaluated:Patient Re-evaluated prior to induction Oxygen Delivery Method: Circle system utilized Preoxygenation: Pre-oxygenation with 100% oxygen Induction Type: IV induction Ventilation: Mask ventilation without difficulty Laryngoscope Size: Mac and 3 Grade View: Grade II Tube type: Oral Tube size: 7.0 mm Number of attempts: 1 Placement Confirmation: breath sounds checked- equal and bilateral,  positive ETCO2 and ETT inserted through vocal cords under direct vision Secured at: 22 cm Tube secured with: Tape Dental Injury: Teeth and Oropharynx as per pre-operative assessment

## 2018-10-07 NOTE — Transfer of Care (Signed)
Immediate Anesthesia Transfer of Care Note  Patient: [name]  Procedure(s) Performed: HYSTERECTOMY ABDOMINAL, Left SALPINGECTOMY (Bilateral )  Patient Location: PACU  Anesthesia Type:General  Level of Consciousness: sedated  Airway & Oxygen Therapy: Patient Spontanous Breathing and Patient connected to nasal cannula oxygen  Post-op Assessment: Report given to RN, Post -op Vital signs reviewed and stable and Patient moving all extremities  Post vital signs: Reviewed and stable  Last Vitals:  Vitals Value Taken Time  BP    Temp    Pulse 75 10/07/18 0904  Resp 16 10/07/18 0904  SpO2 100 % 10/07/18 0904  Vitals shown include unvalidated device data.  Last Pain:  Vitals:   10/07/18 0634  TempSrc:   PainSc: 4          Complications: No apparent anesthesia complications

## 2018-10-07 NOTE — Op Note (Signed)
PREOPERATIVE DIAGNOSES: 1. Enlarged fibroid uterus. 2. Abnormal uterine bleeding.  POSTOPERATIVE DIAGNOSES: 1. Enlarged fibroid uterus. 2. Abnormal uterine bleeding.  PROCEDURE PERFORMED: Total abdominal hysterectomy with left salpingectomy  SURGEON: Dr. Lucillie Garfinkel ASSISTANT: Dr. Louretta Shorten  ANESTHESIA: General   ESTIMATED BLOOD LOSS: 150cc.  URINE OUTPUT AND IVF: See anesthesia record  COMPLICATIONS: None  TUBES: None.  DRAINS: Foley to gravity.  PATHOLOGY: Uterus, cervix, left fallopian tubes were sent to pathology for review.  FINDINGS: On exam, under anesthesia, normal appearing vulva and vagina, a 12 week size uteru  Operative findings demonstrated a mildly enlarged uterus with fibroids. Normal ovaries bilaterally. Right fallopian tube absent. Left WNL.The bowel and omentum were normal appearing.  Procedure: The patient was prepped and draped in the usual sterile manner for an abdominal procedure. A pfannenstiel incision was made and carried down to the underlying fascia. Fascia was incised in the midline and extended bilaterally with mayo scissors. Underlying rectus muscles were separated from the fascia superiorly and inferiorly in the usual fashion. Peritoneum was entered sharply with hemostat and metz with good visualization of the bladder and bowel. Once inside the abdominal cavity, a self-retaining retractor was placed. The uterus was then identified and grasped with upward traction. The round ligaments on either side were identified and individually dissected and ligated. This allowed [place] to then create a bladder flap by both blunt and sharp dissection. The fallopian tube and ovarian ligament were isolated through the broad ligament from the uterine body and ligasure used to remove easily on left. Right uteroovarian crasped with ligasure and transected. Absent right fallopian tube.  We then skeletonized the uterine vessels on either side and carefully dissected the  bladder flap anteriorly. Posteriorly, the peritoneum was dissected down toward the uterosacral ligaments. Ligature used at each isthmic portion of the cervical body junction where the uterine arteries adjoined the uterus.  The remainder of the uterus was then removed by the clamp-cut-ligation technique using #0 Vicryl or ligasure on all major pedicles. With removal of the uterus, the vaginal cuff was closed with two haney stitches and then with serial figure of either stiches using #0 vicryl. Hemostasis was then inspected and secured throughout the entire area including the vaginal cuff, all pedicles, and the bladder. The lap sponges were then removed and the self-retaining retractor was removed. The patient tolerated the operation nicely. There were no complications associated with this surgical procedure to this point. The sponge count was correct times 2 at this time. The Foley catheter was inspected and clear urine was noted. Having removed all instruments and packs, we then began closure of the abdomen. The peritoneum was closed with #2-0 Vicryl in a running continuous manner. The fascia was closed with #0 Vicryl in a running continuous manner and the subcutaneous tissue was also closed with #2-0 Vicryl in a running continuous manner. Hemostasis was secured throughout the entire layers. The incision was then closed as noted in the above operative findings. The patient tolerated the operation nicely and was then taken to the Recovery Room in good condition.    Lucillie Garfinkel MD

## 2018-10-07 NOTE — Progress Notes (Signed)
No updates to above H&P. Patient arrived NPO and was consented in PACU. Risks again discussed, all questions answered, and consent signed. Proceed with above surgery.    Elise Leger MD  

## 2018-10-08 ENCOUNTER — Encounter (HOSPITAL_COMMUNITY): Payer: Self-pay | Admitting: Obstetrics and Gynecology

## 2018-10-08 LAB — CBC
HCT: 37.2 % (ref 36.0–46.0)
Hemoglobin: 12.6 g/dL (ref 12.0–15.0)
MCH: 29.6 pg (ref 26.0–34.0)
MCHC: 33.9 g/dL (ref 30.0–36.0)
MCV: 87.5 fL (ref 80.0–100.0)
Platelets: 178 10*3/uL (ref 150–400)
RBC: 4.25 MIL/uL (ref 3.87–5.11)
RDW: 11.9 % (ref 11.5–15.5)
WBC: 17 10*3/uL — ABNORMAL HIGH (ref 4.0–10.5)
nRBC: 0 % (ref 0.0–0.2)

## 2018-10-08 MED ORDER — IBUPROFEN 600 MG PO TABS: 600.0000 mg | ORAL_TABLET | Freq: Four times a day (QID) | ORAL | 0 refills | Status: DC | PRN

## 2018-10-08 MED ORDER — OXYCODONE HCL 5 MG PO TABS: 5.0000 mg | ORAL_TABLET | ORAL | 0 refills | Status: DC | PRN

## 2018-10-08 MED FILL — IBUPROFEN 600 MG TABLET: 600 | 8 days supply | Qty: 30 | Fill #0

## 2018-10-08 MED FILL — oxyCODONE HCL 5 MG TABS: 5 | 2 days supply | Qty: 20 | Fill #0

## 2018-10-08 NOTE — Plan of Care (Signed)
  Problem: Education: Goal: Knowledge of General Education information will improve Description: Including pain rating scale, medication(s)/side effects and non-pharmacologic comfort measures Outcome: Progressing   Problem: Education: Goal: Knowledge of General Education information will improve Description: Including pain rating scale, medication(s)/side effects and non-pharmacologic comfort measures Outcome: Progressing   Problem: Activity: Goal: Risk for activity intolerance will decrease Outcome: Progressing   Problem: Nutrition: Goal: Adequate nutrition will be maintained Outcome: Progressing   Problem: Pain Managment: Goal: General experience of comfort will improve Outcome: Progressing

## 2018-10-08 NOTE — Progress Notes (Signed)
1 Day Post-Op Procedure(s) (LRB): HYSTERECTOMY ABDOMINAL, Left SALPINGECTOMY (Bilateral)  Subjective: Patient reports tolerating PO and + flatus.    Objective: I have reviewed patient's vital signs, intake and output and medications.  General: alert, cooperative and appears stated age Resp: NWOB Cardio: regular rate GI: soft, non-tender; no masses,  no organomegaly and incision: clean, dry and intact Extremities: extremities normal, atraumatic, no cyanosis or edema Vaginal Bleeding: minimal  Assessment: s/p Procedure(s): HYSTERECTOMY ABDOMINAL, Left SALPINGECTOMY (Bilateral): stable, progressing well and tolerating diet  Plan: Discharge home await trial of void  LOS: 1 day    Tyson Dense 10/08/2018, 9:57 AM

## 2018-10-08 NOTE — Discharge Summary (Signed)
Physician Discharge Summary  Patient ID: Mary [name] MRN: [id number] DOB/AGE: 1970-12-04 48 y.o.  Admit date: 10/07/2018 Discharge date: 10/08/2018  Admission Diagnoses:  Discharge Diagnoses:  Active Problems:   Abnormal uterine bleeding   S/P hysterectomy   Discharged Condition: good  Hospital Course: Pt is a 48yo admitted for scheduled above surgery. She Had an uncomplicated TAH via min[place]ap and left salpingectomy with an EBL of 150cc. She had an uncomplicated postoperative course and on POD#1 was meeting all goals such as ambulating, passing flatus, voiding freely, and tolerating a general diet and she was deemed stable for discharge home.    Consults: None  Significant Diagnostic Studies: labs:  CBC    Component Value Date/Time   WBC 17.0 (H) 10/08/2018 0138   RBC 4.25 10/08/2018 0138   HGB 12.6 10/08/2018 0138   HCT 37.2 10/08/2018 0138   PLT 178 10/08/2018 0138   MCV 87.5 10/08/2018 0138   MCH 29.6 10/08/2018 0138   MCHC 33.9 10/08/2018 0138   RDW 11.9 10/08/2018 0138   LYMPHSABS 1.6 09/15/2018 0241   MONOABS 0.5 09/15/2018 0241   EOSABS 0.2 09/15/2018 0241   BASOSABS 0.1 09/15/2018 0241     Treatments: surgery: TAH LS  Discharge Exam: Blood pressure 108/74, pulse 78, temperature 98.4 F (36.9 C), temperature source Oral, resp. rate 18, height 5\' 4"  (1.626 m), weight 74.2 kg, SpO2 99 %. General appearance: alert, cooperative and appears stated age Back: symmetric, no curvature. ROM normal. No CVA tenderness. Resp: NWOB Cardio: regular rate and rhythm GI: soft, appropriately tender, minimal distention Extremities: extremities normal, atraumatic, no cyanosis or edema, Homans sign is negative, no sign of DVT and no edema, redness or tenderness in the calves or thighs Incision/Wound:c/d/i GYN: No bleeding  Disposition: Discharge disposition: 01-Home or Self Care       Discharge Instructions    Call MD for:   Complete by: As directed    Heavy  vaginal bleeding or abnormal vaginal discharge   Call MD for:  difficulty breathing, headache or visual disturbances   Complete by: As directed    Call MD for:  persistant nausea and vomiting   Complete by: As directed    Call MD for:  redness, tenderness, or signs of infection (pain, swelling, redness, odor or green/yellow discharge around incision site)   Complete by: As directed    Call MD for:  severe uncontrolled pain   Complete by: As directed    Call MD for:  temperature >100.4   Complete by: As directed    Diet general   Complete by: As directed    Driving Restrictions   Complete by: As directed    Do not drive until you are off narcotic pain medications and you feel like you can react in an emergency.   Increase activity slowly   Complete by: As directed    Leave dressing on - Keep it clean, dry, and intact until clinic visit   Complete by: As directed    Gently peel off 3 days after leaving the hospital   Lifting restrictions   Complete by: As directed    Don't lift anything more than 15-20 pounds   Sexual Activity Restrictions   Complete by: As directed    Nothing in the vagina x 2 to 6 weeks. We will discuss at clinic visit.     Allergies as of 10/08/2018      Reactions   Molds & Smuts Shortness Of Breath   Effexor [venlafaxine] Nausea  Only      Medication List    STOP taking these medications   meloxicam 15 MG tablet Commonly known as: MOBIC   nortriptyline 75 MG capsule Commonly known as: PAMELOR     TAKE these medications   ALREX OP Place 1 drop into both eyes daily as needed (pain/irritation).   Azelaic Acid 15 % cream Apply 1 application topically daily. After skin is thoroughly washed and patted dry, gently but thoroughly massage a thin film of azelaic acid cream into the affected area twice daily, in the morning and evening.   B-complex with vitamin C tablet Take 1 tablet by mouth daily.   buPROPion 300 MG 24 hr tablet Commonly known as:  WELLBUTRIN XL Take 300 mg by mouth daily.   calcium carbonate 500 MG chewable tablet Commonly known as: TUMS - dosed in mg elemental calcium Chew 1 tablet by mouth daily.   cholecalciferol 25 MCG (1000 UT) tablet Commonly known as: VITAMIN D3 Take 1,000 Units by mouth daily.   hydroxypropyl methylcellulose / hypromellose 2.5 % ophthalmic solution Commonly known as: ISOPTO TEARS / GONIOVISC Place 1 drop into both eyes 3 (three) times daily as needed for dry eyes.   ibuprofen 600 MG tablet Commonly known as: ADVIL Take 1 tablet (600 mg total) by mouth every 6 (six) hours as needed. What changed:   medication strength  how much to take  reasons to take this   linaclotide 145 MCG Caps capsule Commonly known as: LINZESS Take 145 mcg by mouth daily as needed (constipation).   oxyCODONE 5 MG immediate release tablet Commonly known as: Oxy IR/ROXICODONE Take 1-2 tablets (5-10 mg total) by mouth every 4 (four) hours as needed for moderate pain.   tretinoin 0.05 % cream Commonly known as: RETIN-A Apply 1 application topically 3 (three) times a week.        Signed: Tyson Dense 10/08/2018, 1:53 PM

## 2018-10-10 ENCOUNTER — Other Ambulatory Visit: Payer: Self-pay | Admitting: *Deleted

## 2018-10-10 NOTE — Patient Outreach (Signed)
[facility]) Care Management  10/10/2018  Mary [name] 07/15/1970 [id number]   Transition of care telephone call  Referral received: 10/02/18 Initial outreach: 10/06/18- pre-op call completed Initial post hospital discharge outreach: 10/10/18 Insurance: [place]  Initial unsuccessful telephone call to patient's home/mobile number in order to complete transition of care assessment; no answer, left HIPAA compliant voicemail message requesting return call.   Objective: Per the electronic medical record, Mary [name] was hospitalized at [facility] from 8/18/-8/19 for total abdominal hysterectomy with left salpingectomy done 10/07/18 to treat enlarged fibroid uterus and abnormal uterine bleeding.  Comorbidities include: Comorbidities include: GERD, IBS, stomach ulcer, chronic fatigue syndrome, rosacea, B12 deficiency, depression, uterine fibroids, vit D deficiency, myalgia She was discharged to home on 10/08/18 without the need for home health services or durable medical equipment per the discharge summary.   Plan: If no return call from patient by end of business day, this RNCM will route unsuccessful outreach letter with [place] Management pamphlet and 24 hour Nurse Advice Line Magnet to [place] Management clinical pool to be mailed to patient's home address. This RNCM will attempt another outreach within 4 business days.  Barrington Ellison RN,CCM,CDE [place] Management Coordinator Office Phone [phone] Office Fax [phone]

## 2018-10-10 NOTE — Anesthesia Postprocedure Evaluation (Signed)
Anesthesia Post Note  Patient: Mary [name]  Procedure(s) Performed: HYSTERECTOMY ABDOMINAL, Left SALPINGECTOMY (Bilateral )     Patient location during evaluation: PACU Anesthesia Type: General Level of consciousness: awake and alert Pain management: pain level controlled Vital Signs Assessment: post-procedure vital signs reviewed and stable Respiratory status: spontaneous breathing, nonlabored ventilation, respiratory function stable and patient connected to nasal cannula oxygen Cardiovascular status: blood pressure returned to baseline and stable Postop Assessment: no apparent nausea or vomiting Anesthetic complications: no    Last Vitals:  Vitals:   10/08/18 0610 10/08/18 1512  BP: 108/74 122/80  Pulse: 78 76  Resp: 18 18  Temp: 36.9 C 36.9 C  SpO2: 99% 100%    Last Pain:  Vitals:   10/08/18 1512  TempSrc: Oral  PainSc:                  [place]

## 2018-10-11 MED FILL — buPROPion HCL ER (XL) 300 M: 300 | 90 days supply | Qty: 90 | Fill #1

## 2018-10-15 ENCOUNTER — Ambulatory Visit: Payer: Self-pay | Admitting: *Deleted

## 2018-10-15 ENCOUNTER — Other Ambulatory Visit: Payer: Self-pay | Admitting: *Deleted

## 2018-10-15 NOTE — Patient Outreach (Signed)
[facility]) Care Management  10/15/2018  Caeley Mcclymonds 09-16-1970 [id number]   Transition of care telephone call  Referral received: 10/02/18 Initial outreach: 10/06/18- pre-op call completed Initial post hospital discharge outreach: 10/10/18 Insurance: [place]  Second unsuccessful telephone call to patient's home/mobile number in order to complete transition of care assessment; no answer, left HIPAA compliant voicemail message requesting return call.   Objective: Per the electronic medical record, Laramie Vanwormer was hospitalized at [facility] from 8/18/-8/19 for total abdominal hysterectomy with left salpingectomy done 10/07/18 to treat enlarged fibroid uterus and abnormal uterine bleeding.  Comorbidities include: Comorbidities include:GERD, IBS, stomach ulcer, chronic fatigue syndrome, rosacea, B12 deficiency, depression, uterine fibroids, vit D deficiency, myalgia She was discharged to home on 10/08/18 without the need for home health services or durable medical equipment per the discharge summary.   Plan: This RNCM will attempt another outreach within 4 business days.  Barrington Ellison RN,CCM,CDE [place] Management Coordinator Office Phone [phone] Office Fax [phone]

## 2018-10-16 ENCOUNTER — Ambulatory Visit: Payer: No Typology Code available for payment source | Admitting: Family Medicine

## 2018-10-17 ENCOUNTER — Ambulatory Visit: Payer: Self-pay | Admitting: Clinical

## 2018-10-20 ENCOUNTER — Ambulatory Visit: Payer: Self-pay | Admitting: *Deleted

## 2018-10-20 ENCOUNTER — Other Ambulatory Visit: Payer: Self-pay | Admitting: *Deleted

## 2018-10-20 NOTE — Patient Outreach (Signed)
[facility]) Care Management  10/20/2018  Mary [name] September 10, 1970 [id number]   Transition of care telephone call  Referral received:10/02/18 Initial outreach:10/06/18- pre-op call completed Initial post hospital discharge outreach: 10/10/18 Insurance: [place] unsuccessful telephone call to patient's preferred contact number in order to complete post hospital discharge transition of care assessment; no answer, left HIPAA compliant message requesting return call.   Objective: Per the electronic medical record,Mary Raemorewas hospitalized at [facility] from 8/18/-8/19 for total abdominal hysterectomy with left salpingectomy done 10/07/18 to treat enlarged fibroid uterus and abnormal uterine bleeding.  Comorbidities include:Comorbidities include:GERD, IBS, stomach ulcer, chronic fatigue syndrome, rosacea, B12 deficiency, depression, uterine fibroids, vit D deficiency, myalgia She was discharged to home on8/19/20 without the need for home health services or durable medical equipment per the discharge summary.   Plan: If no return call from patient, will close case to [place] Management services in 10 business days after initial post hospital discharge outreach, on 10/23/18.  Barrington Ellison RN,CCM,CDE [place] Management Coordinator Office Phone [phone] Office Fax [phone]

## 2018-10-23 ENCOUNTER — Other Ambulatory Visit: Payer: Self-pay | Admitting: *Deleted

## 2018-10-23 NOTE — Patient Outreach (Signed)
[place] [facility]) Care Management  10/23/2018  Mary [name] Jan 21, 1971 [id number]   Transition of Care Case Closure- Unsuccessful Outreach and no longer has [facility] as of 10/21/18 Referral received:10/02/18 Initial outreach:10/06/18- pre-op call completed Initial post hospital discharge outreach: 10/10/18 Insurance: [place]  Objective: Per the electronic medical record,Keith Raemorewas hospitalized at [facility] from 8/18/-8/19 for total abdominal hysterectomy with left salpingectomy done 10/07/18 to treat enlarged fibroid uterus and abnormal uterine bleeding.  Comorbidities include:Comorbidities include:GERD, IBS, stomach ulcer, chronic fatigue syndrome, rosacea, B12 deficiency, depression, uterine fibroids, vit D deficiency, myalgia She was discharged to home on8/19/20 without the need for home health services or durable medical equipment per the discharge summary.  Assessment: Unable to complete post hospital discharge transition of care assessment; no return call from patient after 3 attempts and no response to request to contact this RNCM in unsuccessful outreach letter mailed to patient's home on  10/10/18.   Plan: Case closed to [place] Management services as it has been 10 business days since initial post hospital discharge outreach attempt. Also patient advised this RNCM she would no longer hold [facility] insurance as of 10/21/18 due to her separation from the system on 09/22/18.  Barrington Ellison RN,CCM,CDE [place] Management Coordinator Office Phone [phone] Office Fax [phone]

## 2018-10-29 ENCOUNTER — Encounter: Payer: Self-pay | Admitting: Family Medicine

## 2018-10-30 ENCOUNTER — Encounter: Payer: Self-pay | Admitting: Family Medicine

## 2018-10-31 ENCOUNTER — Ambulatory Visit: Payer: No Typology Code available for payment source | Admitting: Clinical

## 2018-11-18 ENCOUNTER — Ambulatory Visit: Payer: No Typology Code available for payment source | Admitting: Clinical

## 2018-12-02 ENCOUNTER — Ambulatory Visit: Payer: No Typology Code available for payment source | Admitting: Clinical

## 2018-12-16 MED FILL — DOXYCYCLINE HYCLATE 100 MG: 100 | 30 days supply | Qty: 30 | Fill #1

## 2020-04-04 IMAGING — CT CT ABDOMEN AND PELVIS WITH CONTRAST
2 of 5 series · 16 of 46 positions shown, 18 images · IV contrast (ISOVUE)
Comparison: None.

CLINICAL DATA: Body aches and muscle aches for 3 weeks. Global
abdominal pain for two days.

EXAM:
CT ABDOMEN AND PELVIS WITH CONTRAST
TECHNIQUE: Multidetector CT imaging of the abdomen and pelvis was performed
using the standard protocol following bolus administration of
intravenous contrast.
CONTRAST:  100mL OMNIPAQUE IOHEXOL 300 MG/ML SOLN, 30mL OMNIPAQUE
IOHEXOL 300 MG/ML SOLN

[Series 2: axial st · axial · 0.75mm/px · z∈[-518,-133]mm · 13 of 89 slices shown, 15 images]
[im 6/89  soft-tissue]
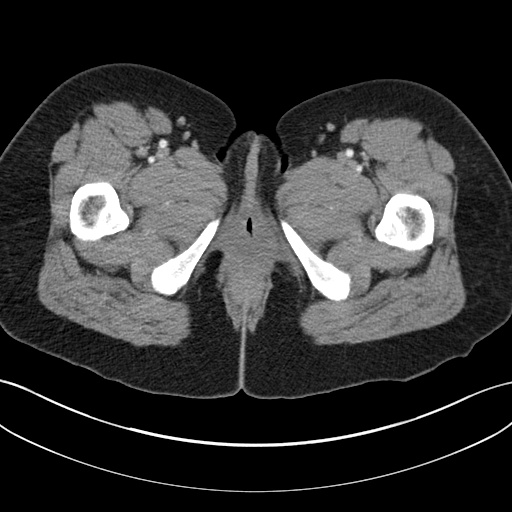
[im 6/89  bone]
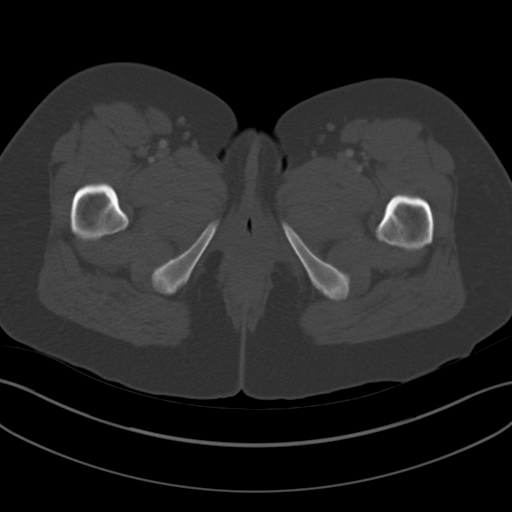
[im 12/89  soft-tissue]
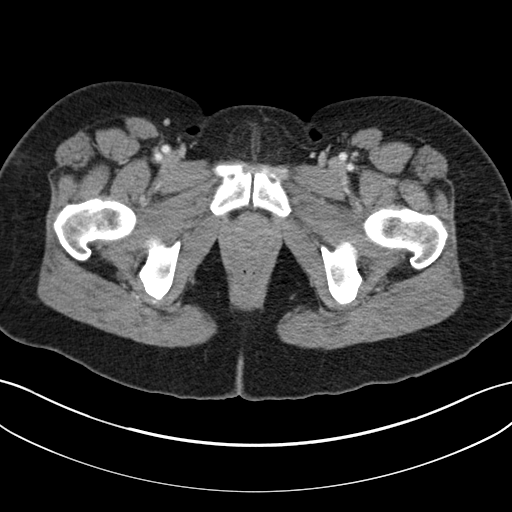
[im 17/89  soft-tissue]
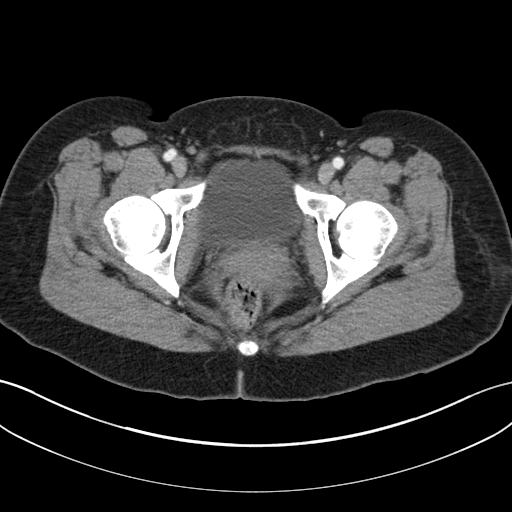
[im 28/89  soft-tissue]
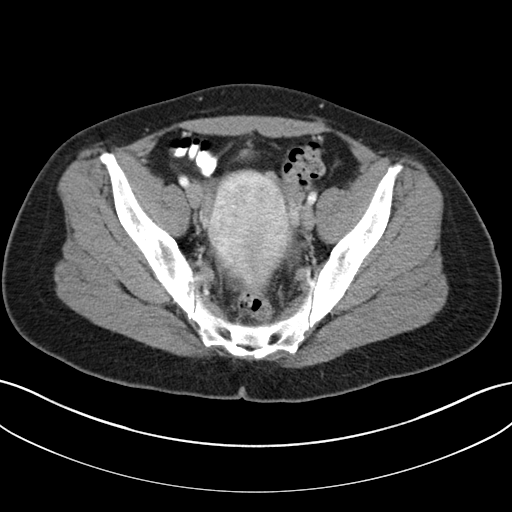
[im 34/89  soft-tissue]
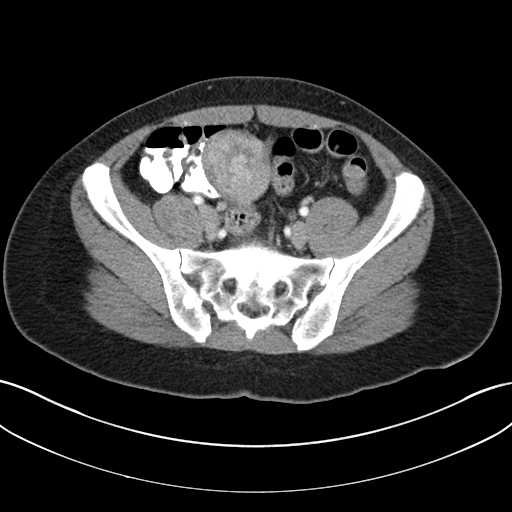
[im 39/89  soft-tissue]
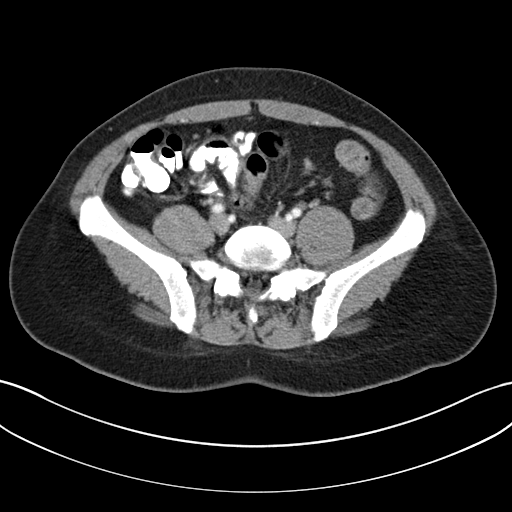
[im 45/89  soft-tissue]
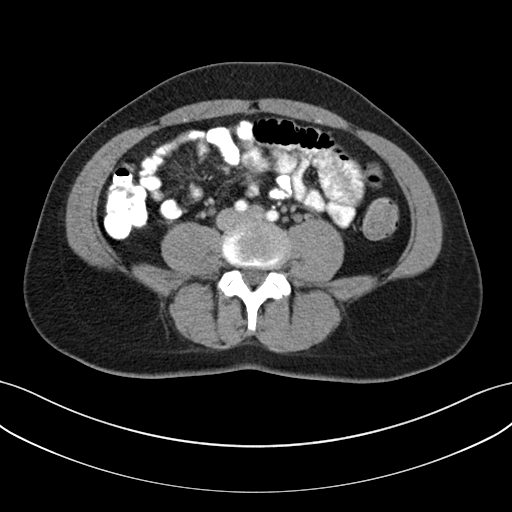
[im 50/89  soft-tissue]
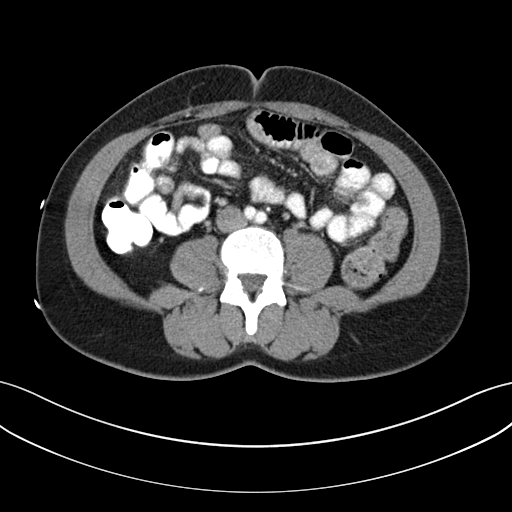
[im 56/89  soft-tissue]
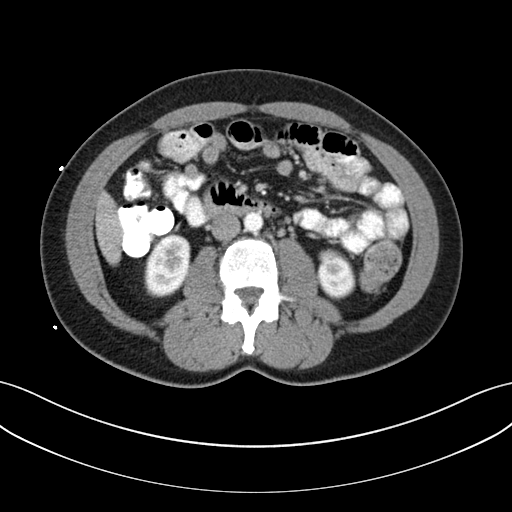
[im 56/89  bone]
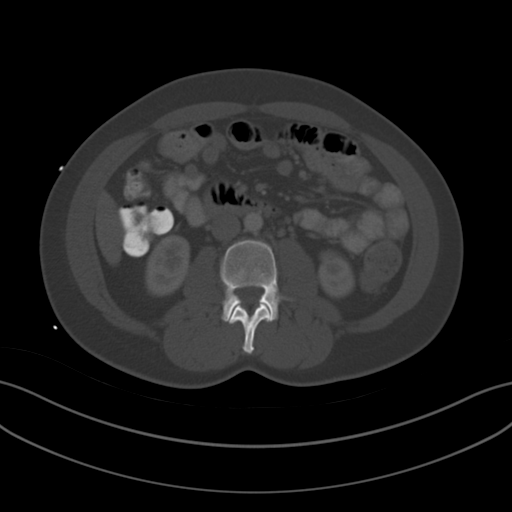
[im 61/89  soft-tissue]
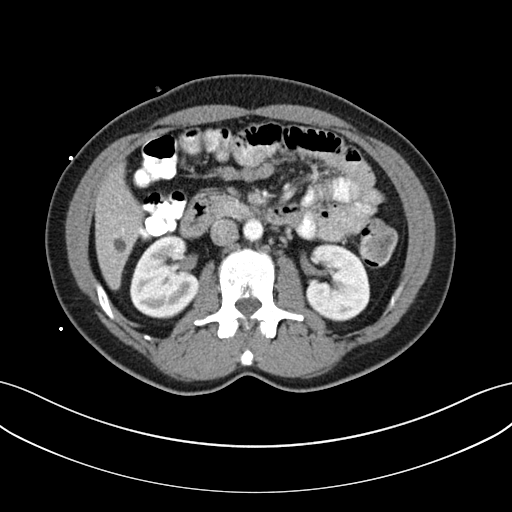
[im 72/89  soft-tissue]
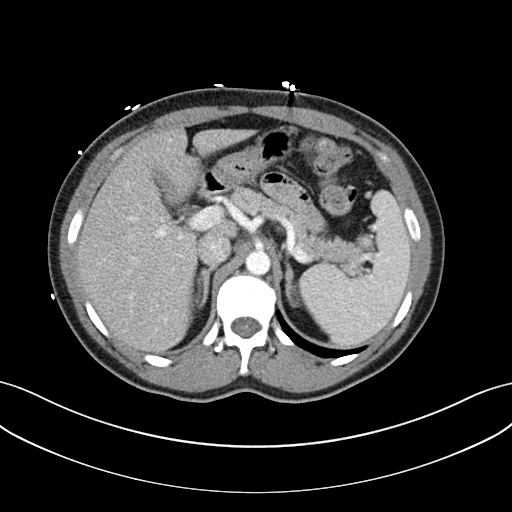
[im 78/89  soft-tissue]
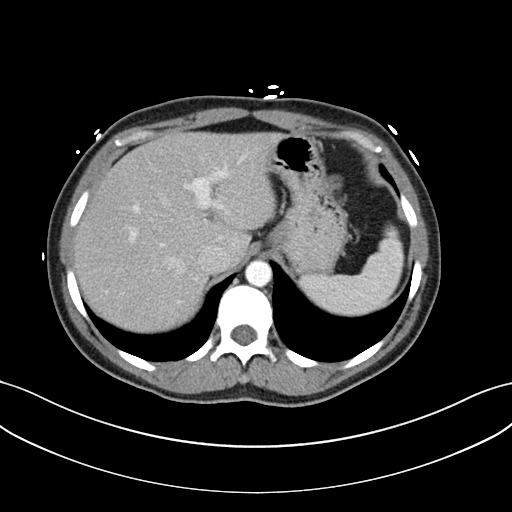
[im 83/89  soft-tissue]
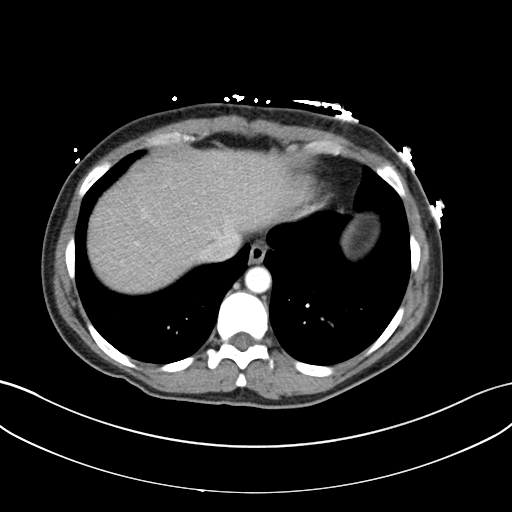

[Series 5: coronal st · coronal · 0.76mm/px · 3 of 136 slices shown]
[im 46/136  soft-tissue]
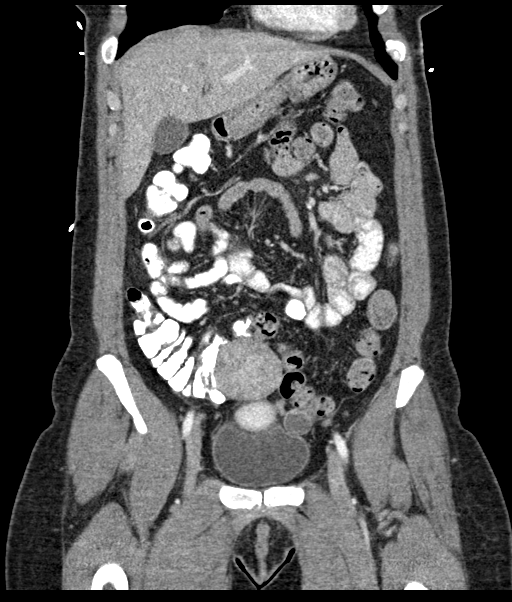
[im 61/136  soft-tissue]
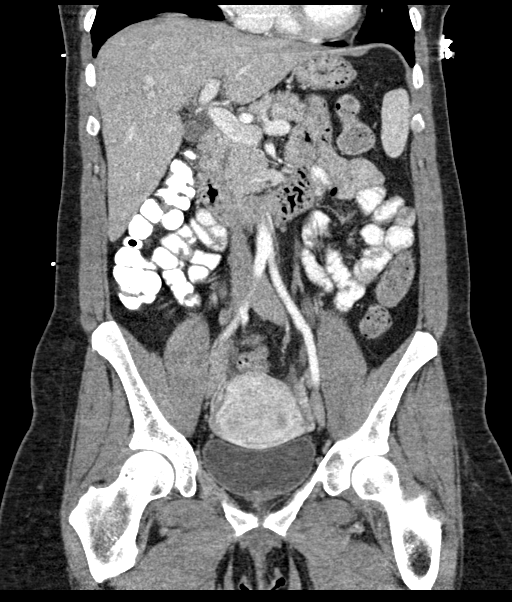
[im 76/136  soft-tissue]
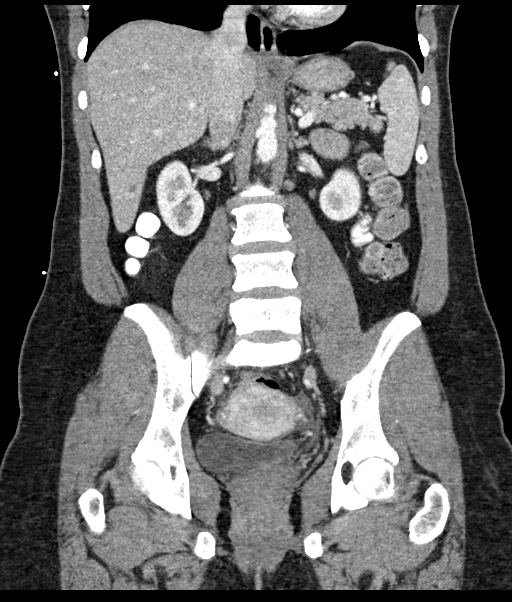

[16 of 46 positions shown; findings below may reference images not displayed]

FINDINGS: Lower chest:  No contributory findings.

Hepatobiliary: Small cystic density in the inferior right liver.No
evidence of biliary obstruction or stone.

Pancreas: Unremarkable.

Spleen: Unremarkable.

Adrenals/Urinary Tract: Negative adrenals. No hydronephrosis or
ureteral stone. 3 mm left renal calculus. Unremarkable bladder.

Stomach/Bowel:  No obstruction. No appendicitis.

Vascular/Lymphatic: No acute vascular abnormality. No mass or
adenopathy.

Reproductive:Heterogeneous and enlarged uterus attributed to
intramural fibroids including a subserosal fibroid measuring 5.7 cm
which is pedunculated. Corpus luteum on the left. Less intensely
enhancing cervix without masslike thickening.

Other: No ascites or pneumoperitoneum. Trace pelvic fluid which may
be physiologic.

Musculoskeletal: No acute abnormalities.
IMPRESSION: 1. No acute finding.
2. Fibroid uterus including a pedunculated 6 cm fibroid.
3. Small left renal calculus.

## 2020-11-15 ENCOUNTER — Encounter: Payer: Self-pay | Admitting: Family Medicine

## 2020-11-15 ENCOUNTER — Ambulatory Visit (INDEPENDENT_AMBULATORY_CARE_PROVIDER_SITE_OTHER): Payer: Self-pay | Admitting: Family Medicine

## 2020-11-15 ENCOUNTER — Other Ambulatory Visit: Payer: Self-pay

## 2020-11-15 VITALS — BP 106/72 | HR 73 | Temp 97.8°F | Ht 64.0 in | Wt 163.0 lb

## 2020-11-15 DIAGNOSIS — L409 Psoriasis, unspecified: Secondary | ICD-10-CM

## 2020-11-15 DIAGNOSIS — L52 Erythema nodosum: Secondary | ICD-10-CM

## 2020-11-15 DIAGNOSIS — L900 Lichen sclerosus et atrophicus: Secondary | ICD-10-CM

## 2020-11-15 MED ORDER — BETAMETHASONE VALERATE 0.1 % EX OINT: 1.0000 "application " | TOPICAL_OINTMENT | Freq: Two times a day (BID) | CUTANEOUS | 5 refills | Status: AC

## 2020-11-15 MED ORDER — ESTRADIOL 0.1 MG/GM VA CREA: TOPICAL_CREAM | VAGINAL | 12 refills | Status: AC

## 2020-11-15 NOTE — Patient Instructions (Addendum)
Great to see you today.    we will inform you of lab results once received either by echart message or telephone call.   - echart message- for normal results that have been seen by the patient already.   - telephone call: abnormal results or if patient has not viewed results in their echart. Lichen Sclerosus Lichen sclerosus is a skin problem. It can happen on any part of the body. It happens most often in the areas around the anus or the genitals. It can cause itching and discomfort. Treatment can help to control symptoms. It can also help prevent scarring that may lead to other problems. What are the causes? The cause of this condition is not known. It is not passed from one person to another. What increases the risk? Being a woman who has reached menopause. Being a man who was not circumcised. Being a child who is about to reach puberty. What are the signs or symptoms? Symptoms of this condition include: Thin, wrinkled, white areas on the skin. Thickened white areas on the skin. Red and swollen patches on the skin. Tears or cracks in the skin. Bruising. Blood blisters. Very bad itching. Pain, itching, or burning when peeing (urinating). Children are also most likely to have trouble pooping (constipation). Some adults too can have trouble pooping. How is this treated? This condition may be treated with: Creams or ointments (topical steroids) that are put on the skin in the affected areas. This is the most common treatment. Medicines that are taken by mouth. Topical immunotherapy. This is putting creams and ointments on the affected area. The creams and ointments will make your immunity strong in order to fight the infection. This is needed if steroids have not helped. Surgery. This is only needed if the condition is very bad and is causing problems such as scarring. Follow these instructions at home: Medicines Take over-the-counter and prescription medicines only as told by your  health care provider. Use creams or ointments as told by your doctor. Skin care Do not scratch the affected areas of skin. If you are a woman, keep the vagina as clean and dry as you can. Clean the affected area of skin gently with water only. Avoid using rough towels or toilet paper. Avoid products that irritate the skin. These include soap and scented lotions. Your doctor will tell you what creams to use to treat itching. General instructions Keep all follow-up visits. You may need to take these actions to prevent or treat trouble pooping: Drink enough fluid to keep your pee (urine) pale yellow. Take over-the-counter or prescription medicines. Eat foods that are high in fiber. These include beans, whole grains, and fresh fruits and vegetables. Limit foods that are high in fat and sugar. These include fried or sweet foods. Contact a doctor if: Your redness, swelling, or pain gets worse. You have fluid, blood, or pus coming from the area. You have new red and swollen patches on your skin. You have a fever. You have pain during sex. Get help right away if: You have very bad pain or burning in the affected areas, especially in the area around your vagina, penis, or anus. Summary Lichen sclerosus is a skin problem. It can cause itching and discomfort. This condition is usually treated with creams or ointments that are put on the skin in the affected areas. Use medicines only as told by your doctor. Do not scratch the affected areas of skin. Keep all follow-up visits. This information is not intended  to replace advice given to you by your health care provider. Make sure you discuss any questions you have with your health care provider. Document Revised: 06/20/2019 Document Reviewed: 06/20/2019 Elsevier Patient Education  [place].

## 2020-11-15 NOTE — Progress Notes (Signed)
This visit occurred during the SARS-CoV-2 public health emergency.  Safety protocols were in place, including screening questions prior to the visit, additional usage of staff PPE, and extensive cleaning of exam room while observing appropriate contact time as indicated for disinfecting solutions.    Mary [name] , [birth year], 50 y.o., female MRN: [id number] Patient Care Team    Relationship Specialty Notifications Start End  Ma Hillock, DO PCP - General Family Medicine  03/12/17   Gerda Diss, DO Consulting Physician Sports Medicine  02/21/18   Gavin Pound, MD Consulting Physician Rheumatology  07/23/18   Linda Hedges, [place] Physician Obstetrics and Gynecology  07/23/18     Chief Complaint  Patient presents with   Rash    Pt c/o rash in vaginal and anal area x 10 mos; pt seen UC and was informed it was lichen sclerosus     Subjective: Pt presents for an OV with complaints of perineum and vaginal discomfort that has been constant over the last 10 months.  She has a history of psoriasis.  She has a family history of psoriasis.  Psoriasis has typically been located bilateral elbows and upper extremities in the past.  She reports erythema nodosum bilateral lower extremities.  She reports the skin tightness and itching was mostly on the perineum area, and has since migrated forward on labia majora and surrounding her rectal area.  She has seen gastroenterology for this condition.  She has also been to the urgent care.  Within these appointments she was provided with clobetasol 0.05% cream and was using twice daily without notable change to her condition.  She has had a hysterectomy, however she does have her ovaries intact.  Depression screen [facility] 2/9 04/08/2018 06/20/2017 03/12/2017 03/12/2017  Decreased Interest 2 2 1  0  Down, Depressed, Hopeless 2 2 0 0  PHQ - 2 Score 4 4 1  0  Altered sleeping 2 2 0 -  Tired, decreased energy 3 2 1  -  Change in appetite 2 2 1  -  Feeling  bad or failure about yourself  0 1 0 -  Trouble concentrating 1 0 0 -  Moving slowly or fidgety/restless 0 0 0 -  Suicidal thoughts 0 0 0 -  PHQ-9 Score 12 11 3  -  Difficult doing work/chores Somewhat difficult - Not difficult at all -    Allergies  Allergen Reactions   Molds & Smuts Shortness Of Breath   Effexor [Venlafaxine] Nausea Only   Social History   Social History Narrative   Single. Bachelor's degree. Works as a traveling Therapist, sports.   Wears a bicycle helmet.   Vegetarian diet.   Drinks caffeine.   Smoke alarm in the home. Wears her seatbelt.   Feels safe in her relationships.   Past Medical History:  Diagnosis Date   Anxiety    Arthritis    Neck, left ankle, clavicle   Chickenpox 1987   Chronic constipation    Chronic fatigue syndrome 0/16/0109   Complication of anesthesia    Costochondritis    De Quervain's tenosynovitis, right 07/03/2017   Injected Jul 03, 2017 Followed by Dr. Burney Gauze   Depression with somatization    Distal radius fracture, right 05/16/2017   Erythema nodosum    Fibroid uterus    Gastritis 2018   GERD (gastroesophageal reflux disease)    Hemorrhoid    History of PCR DNA positive for HSV1    buttocks area   PONV (postoperative nausea and vomiting)  Psoriasis    Rosacea    Stomach ulcer 2007   Vegetarian diet 03/12/2017   Past Surgical History:  Procedure Laterality Date   ABDOMINAL HYSTERECTOMY Bilateral 10/07/2018   Procedure: HYSTERECTOMY ABDOMINAL, Left SALPINGECTOMY;  Surgeon: Tyson Dense, MD;  Location: [place];  Service: Gynecology;  Laterality: Bilateral;   COLONOSCOPY WITH ESOPHAGOGASTRODUODENOSCOPY (EGD)  2007, 2008   Patient reports colonoscopy was unsuccessful secondary to her not being able to tolerate prep. Was being completed for chronic constipation.   ESOPHAGOGASTRODUODENOSCOPY  06/28/2016   gastritis; gastric antrum   SALPINGECTOMY Right 2004   ectopic pregnancy   TONSILLECTOMY  2004   Family History  Problem  Relation Age of Onset   Gout Mother    Rheum arthritis Mother    Osteoarthritis Mother    Breast cancer Mother    Depression Mother    Coronary artery disease Mother    Hyperlipidemia Mother    Hypertension Mother    Kidney disease Mother    Alcohol abuse Father    Arthritis Father    Hearing loss Father    Hyperlipidemia Father    Hypertension Father    Psoriasis Father    Arthritis Brother    Hyperlipidemia Brother    Hypertension Brother    Psoriasis Brother    Breast cancer Maternal Grandmother    Heart murmur Maternal Grandmother    Breast cancer Paternal Grandmother    Psoriasis Paternal Grandmother    Dementia Paternal Grandmother    Heart attack Paternal Grandfather    Stroke Paternal Grandfather    Coronary artery disease Paternal Grandfather    Allergies as of 11/15/2020       Reactions   Molds & Smuts Shortness Of Breath   Effexor [venlafaxine] Nausea Only        Medication List        Accurate as of November 15, 2020  1:10 PM. If you have any questions, ask your nurse or doctor.          STOP taking these medications    ALREX OP Stopped by: Howard Pouch, DO   Azelaic Acid 15 % gel Stopped by: Howard Pouch, DO   B-complex with vitamin C tablet Stopped by: Howard Pouch, DO   buPROPion 300 MG 24 hr tablet Commonly known as: WELLBUTRIN XL Stopped by: Howard Pouch, DO   calcium carbonate 500 MG chewable tablet Commonly known as: TUMS - dosed in mg elemental calcium Stopped by: Howard Pouch, DO   cholecalciferol 25 MCG (1000 UNIT) tablet Commonly known as: VITAMIN D3 Stopped by: Howard Pouch, DO   hydroxypropyl methylcellulose / hypromellose 2.5 % ophthalmic solution Commonly known as: ISOPTO TEARS / GONIOVISC Stopped by: Howard Pouch, DO   ibuprofen 600 MG tablet Commonly known as: ADVIL Stopped by: Howard Pouch, DO   linaclotide 145 MCG Caps capsule Commonly known as: LINZESS Stopped by: Howard Pouch, DO   oxyCODONE 5 MG  immediate release tablet Commonly known as: Oxy IR/ROXICODONE Stopped by: Howard Pouch, DO   tretinoin 0.01 % gel Commonly known as: RETIN-A Stopped by: Howard Pouch, DO   tretinoin 0.05 % cream Commonly known as: RETIN-A Stopped by: Howard Pouch, DO       TAKE these medications    betamethasone valerate ointment 0.1 % Commonly known as: VALISONE Apply 1 application topically 2 (two) times daily. Started by: Howard Pouch, DO   estradiol 0.1 MG/GM vaginal cream Commonly known as: ESTRACE VAGINAL 1 applicator vaginally nightly for 2 weeks, then  3x a week. Started by: Howard Pouch, DO        All past medical history, surgical history, allergies, family history, immunizations andmedications were updated in the EMR today and reviewed under the history and medication portions of their EMR.     ROS: Negative, with the exception of above mentioned in HPI   Objective:  BP 106/72   Pulse 73   Temp 97.8 F (36.6 C) (Oral)   Ht 5\' 4"  (1.626 m)   Wt 163 lb (73.9 kg)   LMP  (LMP Unknown) Comment: spots off and on  SpO2 99%   BMI 27.98 kg/m  Body mass index is 27.98 kg/m. Gen: Afebrile. No acute distress. Nontoxic in appearance, well developed, well nourished.  HENT: AT. [place].  Eyes:Pupils Equal Round Reactive to light, Extraocular movements intact,  Conjunctiva without redness, discharge or icterus. Skin: Psoriasis and scarring bilateral extensor elbow .  X3 areas of erythema nodosum right medial calf. Neuro: Normal gait. PERLA. EOMi. Alert. Oriented x3  Psych: Normal affect, dress and demeanor. Normal speech. Normal thought content and judgment.  No results found. No results found. No results found for this or any previous visit (from the past 24 hour(s)).  Assessment/Plan: Mary [name] is a 50 y.o. female present for OV for  Psoriasis Continue clobetasol cream - Ambulatory referral to Dermatology  Lichen sclerosus Betamethasone ointment trial. Also provided  prescription for estrogen cream if betamethasone cream is too expensive or not effective. - Ambulatory referral to Dermatology  Erythema nodosum - Ambulatory referral to Dermatology  Reviewed expectations re: course of current medical issues. Discussed self-management of symptoms. Outlined signs and symptoms indicating need for more acute intervention. Patient verbalized understanding and all questions were answered. Patient received an After-Visit Summary.    Orders Placed This Encounter  Procedures   Ambulatory referral to Dermatology    Meds ordered this encounter  Medications   betamethasone valerate ointment (VALISONE) 0.1 %    Sig: Apply 1 application topically 2 (two) times daily.    Dispense:  45 g    Refill:  5   estradiol (ESTRACE VAGINAL) 0.1 MG/GM vaginal cream    Sig: 1 applicator vaginally nightly for 2 weeks, then 3x a week.    Dispense:  42.5 g    Refill:  12    Referral Orders         Ambulatory referral to Dermatology       Note is dictated utilizing voice recognition software. Although note has been proof read prior to signing, occasional typographical errors still can be missed. If any questions arise, please do not hesitate to call for verification.   electronically signed by:  Howard Pouch, DO  [place]

## 2020-11-16 ENCOUNTER — Telehealth: Payer: Self-pay

## 2020-11-16 NOTE — Telephone Encounter (Signed)
FYI  Please see below

## 2020-11-16 NOTE — Telephone Encounter (Signed)
Patient states she called dermatology and they stated the do not accept medicaid or self pay can we referral to another office that accepts self pay patients.

## 2021-12-25 LAB — HM MAMMOGRAPHY

## 2021-12-25 LAB — HM PAP SMEAR

## 2021-12-25 LAB — RESULTS CONSOLE HPV: CHL HPV: NEGATIVE

## 2022-01-09 ENCOUNTER — Telehealth: Payer: Self-pay

## 2022-01-09 NOTE — Telephone Encounter (Signed)
Please assist patient with scheduling, thanks.  [name]  [facility] [name] Provider Raoul Pitch, [place] Type Call Who Is Calling Patient / Member / Family / [place] Name Charles Phone Number [phone] Patient Name Mary [name] Patient DOB 18-Jun-1970 Call Type Message Only Information Provided Reason for Call Request to Schedule Office Appointment Initial Comment Caller calling in to schedule an appt. Disp. Time Disposition Final User 01/09/2022 12:30:44 PM General Information Provided Yes Merwyn Katos Call Closed By: Merwyn Katos Transaction Date/Time: 01/09/2022 12:27:15 PM (ET)

## 2022-01-10 ENCOUNTER — Encounter: Payer: Medicaid Other | Admitting: Family Medicine

## 2022-01-10 NOTE — Progress Notes (Signed)
  Same day cancel 

## 2022-01-10 NOTE — Patient Instructions (Signed)
No follow-ups on file.        Great to see you today.  I have refilled the medication(s) we provide.   If labs were collected, we will inform you of lab results once received either by echart message or telephone call.   - echart message- for normal results that have been seen by the patient already.   - telephone call: abnormal results or if patient has not viewed results in their echart.  Health Maintenance, Female Adopting a healthy lifestyle and getting preventive care are important in promoting health and wellness. Ask your health care provider about: The right schedule for you to have regular tests and exams. Things you can do on your own to prevent diseases and keep yourself healthy. What should I know about diet, weight, and exercise? Eat a healthy diet  Eat a diet that includes plenty of vegetables, fruits, low-fat dairy products, and lean protein. Do not eat a lot of foods that are high in solid fats, added sugars, or sodium. Maintain a healthy weight Body mass index (BMI) is used to identify weight problems. It estimates body fat based on height and weight. Your health care provider can help determine your BMI and help you achieve or maintain a healthy weight. Get regular exercise Get regular exercise. This is one of the most important things you can do for your health. Most adults should: Exercise for at least 150 minutes each week. The exercise should increase your heart rate and make you sweat (moderate-intensity exercise). Do strengthening exercises at least twice a week. This is in addition to the moderate-intensity exercise. Spend less time sitting. Even light physical activity can be beneficial. Watch cholesterol and blood lipids Have your blood tested for lipids and cholesterol at 53 years of age, then have this test every 5 years. Have your cholesterol levels checked more often if: Your lipid or cholesterol levels are high. You are older than 53 years of  age. You are at high risk for heart disease. What should I know about cancer screening? Depending on your health history and family history, you may need to have cancer screening at various ages. This may include screening for: Breast cancer. Cervical cancer. Colorectal cancer. Skin cancer. Lung cancer. What should I know about heart disease, diabetes, and high blood pressure? Blood pressure and heart disease High blood pressure causes heart disease and increases the risk of stroke. This is more likely to develop in people who have high blood pressure readings or are overweight. Have your blood pressure checked: Every 3-5 years if you are 18-39 years of age. Every year if you are 40 years old or older. Diabetes Have regular diabetes screenings. This checks your fasting blood sugar level. Have the screening done: Once every three years after age 40 if you are at a normal weight and have a low risk for diabetes. More often and at a younger age if you are overweight or have a high risk for diabetes. What should I know about preventing infection? Hepatitis B If you have a higher risk for hepatitis B, you should be screened for this virus. Talk with your health care provider to find out if you are at risk for hepatitis B infection. Hepatitis C Testing is recommended for: Everyone born from 1945 through 1965. Anyone with known risk factors for hepatitis C. Sexually transmitted infections (STIs) Get screened for STIs, including gonorrhea and chlamydia, if: You are sexually active and are younger than 53 years of age. You are   older than 53 years of age and your health care provider tells you that you are at risk for this type of infection. Your sexual activity has changed since you were last screened, and you are at increased risk for chlamydia or gonorrhea. Ask your health care provider if you are at risk. Ask your health care provider about whether you are at high risk for HIV. Your health  care provider may recommend a prescription medicine to help prevent HIV infection. If you choose to take medicine to prevent HIV, you should first get tested for HIV. You should then be tested every 3 months for as long as you are taking the medicine. Pregnancy If you are about to stop having your period (premenopausal) and you may become pregnant, seek counseling before you get pregnant. Take 400 to 800 micrograms (mcg) of folic acid every day if you become pregnant. Ask for birth control (contraception) if you want to prevent pregnancy. Osteoporosis and menopause Osteoporosis is a disease in which the bones lose minerals and strength with aging. This can result in bone fractures. If you are 65 years old or older, or if you are at risk for osteoporosis and fractures, ask your health care provider if you should: Be screened for bone loss. Take a calcium or vitamin D supplement to lower your risk of fractures. Be given hormone replacement therapy (HRT) to treat symptoms of menopause. Follow these instructions at home: Alcohol use Do not drink alcohol if: Your health care provider tells you not to drink. You are pregnant, may be pregnant, or are planning to become pregnant. If you drink alcohol: Limit how much you have to: 0-1 drink a day. Know how much alcohol is in your drink. In the [place] one drink equals one 12 oz bottle of beer (355 mL), one 5 oz glass of wine (148 mL), or one 1 oz glass of hard liquor (44 mL). Lifestyle Do not use any products that contain nicotine or tobacco. These products include cigarettes, chewing tobacco, and vaping devices, such as e-cigarettes. If you need help quitting, ask your health care provider. Do not use street drugs. Do not share needles. Ask your health care provider for help if you need support or information about quitting drugs. General instructions Schedule regular health, dental, and eye exams. Stay current with your vaccines. Tell your health  care provider if: You often feel depressed. You have ever been abused or do not feel safe at home. Summary Adopting a healthy lifestyle and getting preventive care are important in promoting health and wellness. Follow your health care provider's instructions about healthy diet, exercising, and getting tested or screened for diseases. Follow your health care provider's instructions on monitoring your cholesterol and blood pressure. This information is not intended to replace advice given to you by your health care provider. Make sure you discuss any questions you have with your health care provider. Document Revised: 06/27/2020 Document Reviewed: 06/27/2020 Elsevier Patient Education  2023 [employer]  

## 2022-01-16 ENCOUNTER — Ambulatory Visit (INDEPENDENT_AMBULATORY_CARE_PROVIDER_SITE_OTHER): Payer: Commercial Managed Care - PPO | Admitting: Family Medicine

## 2022-01-16 ENCOUNTER — Encounter: Payer: Self-pay | Admitting: Family Medicine

## 2022-01-16 VITALS — BP 119/82 | HR 66 | Temp 98.0°F | Ht 64.57 in | Wt 169.0 lb

## 2022-01-16 DIAGNOSIS — F339 Major depressive disorder, recurrent, unspecified: Secondary | ICD-10-CM

## 2022-01-16 DIAGNOSIS — E663 Overweight: Secondary | ICD-10-CM | POA: Diagnosis not present

## 2022-01-16 DIAGNOSIS — E538 Deficiency of other specified B group vitamins: Secondary | ICD-10-CM | POA: Diagnosis not present

## 2022-01-16 DIAGNOSIS — Z0000 Encounter for general adult medical examination without abnormal findings: Secondary | ICD-10-CM

## 2022-01-16 DIAGNOSIS — E559 Vitamin D deficiency, unspecified: Secondary | ICD-10-CM

## 2022-01-16 DIAGNOSIS — Z79899 Other long term (current) drug therapy: Secondary | ICD-10-CM | POA: Diagnosis not present

## 2022-01-16 LAB — VITAMIN B12: Vitamin B-12: 466 pg/mL (ref 211–911)

## 2022-01-16 NOTE — Progress Notes (Unsigned)
Patient ID: Mary [name], female  DOB: 1971/01/15, 51 y.o.   MRN: [id number] Patient Care Team    Relationship Specialty Notifications Start End  Ma Hillock, DO PCP - General Family Medicine  03/12/17   Gerda Diss, DO Consulting Physician Sports Medicine  02/21/18   Gavin Pound, MD Consulting Physician Rheumatology  07/23/18   Linda Hedges, [place] Physician Obstetrics and Gynecology  07/23/18   Tyson Dense, MD Consulting Physician Obstetrics and Gynecology  01/16/22     Chief Complaint  Patient presents with   Annual Exam    Subjective:  Mary [name] is a 51 y.o.  Female  present for CPE  All past medical history, surgical history, allergies, family history, immunizations, medications and social history were updated in the electronic medical record today. All recent labs, ED visits and hospitalizations within the last year were reviewed.  Health maintenance:  Colonoscopy: No fhx. Routine screening at 43 > referral placed.  Mammogram: with Dr. Royston Sinner UTD Cervical cancer screening: last pap: 2017, results: normal, neg co-test. Has appt this month for GYN with Dr. Talbert Nan Immunizations: tdap UTD 2021, Influenza UTD (encouraged yearly), Shingrix declined Infectious disease screening: HIV completed, hep C completed Hospitalizations/ED visits: ***      04/08/2018    2:54 PM 06/20/2017    8:19 AM 03/12/2017   12:06 PM 03/12/2017   11:34 AM  Depression screen PHQ 2/9  Decreased Interest _0 0  Down, Depressed, Hopeless 2 2 0 0  PHQ - 2 Score _1 0  Altered sleeping 2 2 0   Tired, decreased energy _2 Change in appetite _3 Feeling bad or failure about yourself  0 1 0   Trouble concentrating 1 0 0   Moving slowly or fidgety/restless 0 0 0   Suicidal thoughts 0 0 0   PHQ-9 Score _4 Difficult doing work/chores Somewhat difficult  Not difficult at all       04/08/2018    2:55 PM 06/20/2017    8:20 AM 03/12/2017   12:07 PM  GAD  7 : Generalized Anxiety Score  Nervous, Anxious, on Edge 0 0 0  Control/stop worrying 2 0 0  Worry too much - different things 2 0 0  Trouble relaxing 1 2 0  Restless 0 1 0  Easily annoyed or irritable 0 0 0  Afraid - awful might happen 1 0 0  Total GAD 7 Score 6 3 0  Anxiety Difficulty   Not difficult at all           Immunization History  Administered Date(s) Administered   Influenza Inj Mdck Quad Pf 03/12/2016   Influenza,inj,Quad PF,6+ Mos 12/07/2014, 03/12/2016, 11/18/2017, 02/17/2019, 11/20/2021   Influenza,inj,quad, With Preservative 12/07/2014   Influenza-Unspecified 11/14/2015   Rabies, IM 04/29/2019   Tdap 07/26/2014, 02/20/2015, 03/29/2019     Past Medical History:  Diagnosis Date   Anxiety    Arthritis    Neck, left ankle, clavicle   Chickenpox 1987   Chronic constipation    Chronic fatigue syndrome 0/10/3816   Complication of anesthesia    Costochondritis    De Quervain's tenosynovitis, right 07/03/2017   Injected Jul 03, 2017 Followed by Dr. Burney Gauze   Depression with somatization    Distal radius fracture, right 05/16/2017   Erythema nodosum    Fibroid uterus    Gastritis 2018   GERD (gastroesophageal reflux disease)  Hemorrhoid    History of PCR DNA positive for HSV1    buttocks area   PONV (postoperative nausea and vomiting)    Psoriasis    Rosacea    Stomach ulcer 2007   Vegetarian diet 03/12/2017   Allergies  Allergen Reactions   Molds & Smuts Shortness Of Breath   Effexor [Venlafaxine] Nausea Only   Past Surgical History:  Procedure Laterality Date   ABDOMINAL HYSTERECTOMY Bilateral 10/07/2018   Procedure: HYSTERECTOMY ABDOMINAL, Left SALPINGECTOMY;  Surgeon: Tyson Dense, MD;  Location: [facility];  Service: Gynecology;  Laterality: Bilateral;   COLONOSCOPY WITH ESOPHAGOGASTRODUODENOSCOPY (EGD)  2007, 2008   Patient reports colonoscopy was unsuccessful secondary to her not being able to tolerate prep. Was being completed for  chronic constipation.   ESOPHAGOGASTRODUODENOSCOPY  06/28/2016   gastritis; gastric antrum   SALPINGECTOMY Right 2004   ectopic pregnancy   TONSILLECTOMY  2004   Family History  Problem Relation Age of Onset   Gout Mother    Rheum arthritis Mother    Osteoarthritis Mother    Breast cancer Mother    Depression Mother    Coronary artery disease Mother    Hyperlipidemia Mother    Hypertension Mother    Kidney disease Mother    Alcohol abuse Father    Arthritis Father    Hearing loss Father    Hyperlipidemia Father    Hypertension Father    Psoriasis Father    Arthritis Brother    Hyperlipidemia Brother    Hypertension Brother    Psoriasis Brother    Breast cancer Maternal Grandmother    Heart murmur Maternal Grandmother    Breast cancer Paternal Grandmother    Psoriasis Paternal Grandmother    Dementia Paternal Grandmother    Heart attack Paternal Grandfather    Stroke Paternal Grandfather    Coronary artery disease Paternal Grandfather    Social History   Social History Narrative   Single. Bachelor's degree. Works as a traveling Therapist, sports.   Wears a bicycle helmet.   Vegetarian diet.   Drinks caffeine.   Smoke alarm in the home. Wears her seatbelt.   Feels safe in her relationships.    Allergies as of 01/16/2022       Reactions   Molds & Smuts Shortness Of Breath   Effexor [venlafaxine] Nausea Only        Medication List        Accurate as of January 16, 2022  1:48 PM. If you have any questions, ask your nurse or doctor.          betamethasone valerate ointment 0.1 % Commonly known as: VALISONE Apply 1 application topically 2 (two) times daily.   estradiol 0.05 mg/24hr patch Commonly known as: CLIMARA - Dosed in mg/24 hr Place 0.05 mg onto the skin once a week.   estradiol 0.1 MG/GM vaginal cream Commonly known as: ESTRACE VAGINAL 1 applicator vaginally nightly for 2 weeks, then 3x a week.   Otezla 30 MG Tabs Generic drug: Apremilast Take by  mouth.        All past medical history, surgical history, allergies, family history, immunizations andmedications were updated in the EMR today and reviewed under the history and medication portions of their EMR.     No results found for this or any previous visit (from the past 2160 hour(s)).  No results found.   ROS 14 pt review of systems performed and negative (unless mentioned in an HPI)  Objective:  BP 119/82  Pulse 66   Temp 98 F (36.7 C)   Ht 5' 4.57" (1.64 m)   Wt 169 lb (76.7 kg)   LMP  (LMP Unknown) Comment: spots off and on  SpO2 98%   BMI 28.50 kg/m   Physical Exam Vitals and nursing note reviewed.  Constitutional:      General: She is not in acute distress.    Appearance: Normal appearance. She is not ill-appearing or toxic-appearing.  HENT:     Head: Normocephalic and atraumatic.     Right Ear: Tympanic membrane, ear canal and external ear normal. There is no impacted cerumen.     Left Ear: Tympanic membrane, ear canal and external ear normal. There is no impacted cerumen.     Nose: No congestion or rhinorrhea.     Mouth/Throat:     Mouth: Mucous membranes are moist.     Pharynx: Oropharynx is clear. No oropharyngeal exudate or posterior oropharyngeal erythema.  Eyes:     General: No scleral icterus.       Right eye: No discharge.        Left eye: No discharge.     Extraocular Movements: Extraocular movements intact.     Conjunctiva/sclera: Conjunctivae normal.     Pupils: Pupils are equal, round, and reactive to light.  Cardiovascular:     Rate and Rhythm: Normal rate and regular rhythm.     Pulses: Normal pulses.     Heart sounds: Normal heart sounds. No murmur heard.    No friction rub. No gallop.  Pulmonary:     Effort: Pulmonary effort is normal. No respiratory distress.     Breath sounds: Normal breath sounds. No stridor. No wheezing, rhonchi or rales.  Chest:     Chest wall: No tenderness.  Abdominal:     General: Abdomen is flat.  Bowel sounds are normal. There is no distension.     Palpations: Abdomen is soft. There is no mass.     Tenderness: There is no abdominal tenderness. There is no right CVA tenderness, left CVA tenderness, guarding or rebound.     Hernia: No hernia is present.  Musculoskeletal:        General: No swelling, tenderness or deformity. Normal range of motion.     Cervical back: Normal range of motion and neck supple. No rigidity or tenderness.     Right lower leg: No edema.     Left lower leg: No edema.  Lymphadenopathy:     Cervical: No cervical adenopathy.  Skin:    General: Skin is warm and dry.     Coloration: Skin is not jaundiced or pale.     Findings: No bruising, erythema, lesion or rash.  Neurological:     General: No focal deficit present.     Mental Status: She is alert and oriented to person, place, and time. Mental status is at baseline.     Cranial Nerves: No cranial nerve deficit.     Sensory: No sensory deficit.     Motor: No weakness.     Coordination: Coordination normal.     Gait: Gait normal.     Deep Tendon Reflexes: Reflexes normal.  Psychiatric:        Mood and Affect: Mood normal.        Behavior: Behavior normal.        Thought Content: Thought content normal.        Judgment: Judgment normal.      No results found.  Assessment/plan: Sharyn Lull  Selden is a 51 y.o. female present for CPE   Patient was encouraged to exercise greater than 150 minutes a week. Patient was encouraged to choose a diet filled with fresh fruits and vegetables, and lean meats. AVS provided to patient today for education/recommendation on gender specific health and safety maintenance.   Return in about 1 year (around 01/18/2023).  Orders Placed This Encounter  Procedures   CBC   Comp Met (CMET)   Lipid panel   TSH   Hemoglobin A1c   B12   No orders of the defined types were placed in this encounter.  Referral Orders  No referral(s) requested today     Electronically  signed by: Howard Pouch, [place]

## 2022-01-16 NOTE — Patient Instructions (Addendum)
No follow-ups on file.  [facility] [employer].   [email or web address]     Great to see you today.  I have refilled the medication(s) we provide.   If labs were collected, we will inform you of lab results once received either by echart message or telephone call.   - echart message- for normal results that have been seen by the patient already.   - telephone call: abnormal results or if patient has not viewed results in their echart.  Health Maintenance, Female Adopting a healthy lifestyle and getting preventive care are important in promoting health and wellness. Ask your health care provider about: The right schedule for you to have regular tests and exams. Things you can do on your own to prevent diseases and keep yourself healthy. What should I know about diet, weight, and exercise? Eat a healthy diet  Eat a diet that includes plenty of vegetables, fruits, low-fat dairy products, and lean protein. Do not eat a lot of foods that are high in solid fats, added sugars, or sodium. Maintain a healthy weight Body mass index (BMI) is used to identify weight problems. It estimates body fat based on height and weight. Your health care provider can help determine your BMI and help you achieve or maintain a healthy weight. Get regular exercise Get regular exercise. This is one of the most important things you can do for your health. Most adults should: Exercise for at least 150 minutes each week. The exercise should increase your heart rate and make you sweat (moderate-intensity exercise). Do strengthening exercises at least twice a week. This is in addition to the moderate-intensity exercise. Spend less time sitting. Even light physical activity can be beneficial. Watch cholesterol and blood lipids Have your blood tested for lipids and cholesterol at 53 years of age, then have this test every 5 years. Have your cholesterol levels checked more often if: Your lipid or  cholesterol levels are high. You are older than 53 years of age. You are at high risk for heart disease. What should I know about cancer screening? Depending on your health history and family history, you may need to have cancer screening at various ages. This may include screening for: Breast cancer. Cervical cancer. Colorectal cancer. Skin cancer. Lung cancer. What should I know about heart disease, diabetes, and high blood pressure? Blood pressure and heart disease High blood pressure causes heart disease and increases the risk of stroke. This is more likely to develop in people who have high blood pressure readings or are overweight. Have your blood pressure checked: Every 3-5 years if you are 72-25 years of age. Every year if you are 60 years old or older. Diabetes Have regular diabetes screenings. This checks your fasting blood sugar level. Have the screening done: Once every three years after age 13 if you are at a normal weight and have a low risk for diabetes. More often and at a younger age if you are overweight or have a high risk for diabetes. What should I know about preventing infection? Hepatitis B If you have a higher risk for hepatitis B, you should be screened for this virus. Talk with your health care provider to find out if you are at risk for hepatitis B infection. Hepatitis C Testing is recommended for: Everyone born from [birth year] through 1967. Anyone with known risk factors for hepatitis C. Sexually transmitted infections (STIs) Get screened for STIs, including gonorrhea and chlamydia, if: You are sexually active and are  younger than 53 years of age. You are older than 53 years of age and your health care provider tells you that you are at risk for this type of infection. Your sexual activity has changed since you were last screened, and you are at increased risk for chlamydia or gonorrhea. Ask your health care provider if you are at risk. Ask your health care  provider about whether you are at high risk for HIV. Your health care provider may recommend a prescription medicine to help prevent HIV infection. If you choose to take medicine to prevent HIV, you should first get tested for HIV. You should then be tested every 3 months for as long as you are taking the medicine. Pregnancy If you are about to stop having your period (premenopausal) and you may become pregnant, seek counseling before you get pregnant. Take 400 to 800 micrograms (mcg) of folic acid every day if you become pregnant. Ask for birth control (contraception) if you want to prevent pregnancy. Osteoporosis and menopause Osteoporosis is a disease in which the bones lose minerals and strength with aging. This can result in bone fractures. If you are 72 years old or older, or if you are at risk for osteoporosis and fractures, ask your health care provider if you should: Be screened for bone loss. Take a calcium or vitamin D supplement to lower your risk of fractures. Be given hormone replacement therapy (HRT) to treat symptoms of menopause. Follow these instructions at home: Alcohol use Do not drink alcohol if: Your health care provider tells you not to drink. You are pregnant, may be pregnant, or are planning to become pregnant. If you drink alcohol: Limit how much you have to: 0-1 drink a day. Know how much alcohol is in your drink. In the [place] one drink equals one 12 oz bottle of beer (355 mL), one 5 oz glass of wine (148 mL), or one 1 oz glass of hard liquor (44 mL). Lifestyle Do not use any products that contain nicotine or tobacco. These products include cigarettes, chewing tobacco, and vaping devices, such as e-cigarettes. If you need help quitting, ask your health care provider. Do not use street drugs. Do not share needles. Ask your health care provider for help if you need support or information about quitting drugs. General instructions Schedule regular health, dental, and  eye exams. Stay current with your vaccines. Tell your health care provider if: You often feel depressed. You have ever been abused or do not feel safe at home. Summary Adopting a healthy lifestyle and getting preventive care are important in promoting health and wellness. Follow your health care provider's instructions about healthy diet, exercising, and getting tested or screened for diseases. Follow your health care provider's instructions on monitoring your cholesterol and blood pressure. This information is not intended to replace advice given to you by your health care provider. Make sure you discuss any questions you have with your health care provider. Document Revised: 06/27/2020 Document Reviewed: 06/27/2020 Elsevier Patient Education  [employer].

## 2022-01-17 ENCOUNTER — Telehealth: Payer: Self-pay | Admitting: Family Medicine

## 2022-01-17 LAB — COMPREHENSIVE METABOLIC PANEL
AG Ratio: 2 (calc) (ref 1.0–2.5)
ALT: 13 U/L (ref 6–29)
AST: 15 U/L (ref 10–35)
Albumin: 4.5 g/dL (ref 3.6–5.1)
Alkaline phosphatase (APISO): 50 U/L (ref 37–153)
BUN: 15 mg/dL (ref 7–25)
CO2: 28 mmol/L (ref 20–32)
Calcium: 9.5 mg/dL (ref 8.6–10.4)
Chloride: 103 mmol/L (ref 98–110)
Creat: 0.88 mg/dL (ref 0.50–1.03)
Globulin: 2.3 g/dL (calc) (ref 1.9–3.7)
Glucose, Bld: 83 mg/dL (ref 65–99)
Potassium: 4.1 mmol/L (ref 3.5–5.3)
Sodium: 138 mmol/L (ref 135–146)
Total Bilirubin: 0.7 mg/dL (ref 0.2–1.2)
Total Protein: 6.8 g/dL (ref 6.1–8.1)

## 2022-01-17 LAB — CBC
HCT: 41.5 % (ref 35.0–45.0)
Hemoglobin: 13.9 g/dL (ref 11.7–15.5)
MCH: 29 pg (ref 27.0–33.0)
MCHC: 33.5 g/dL (ref 32.0–36.0)
MCV: 86.6 fL (ref 80.0–100.0)
MPV: 11.5 fL (ref 7.5–12.5)
Platelets: 176 10*3/uL (ref 140–400)
RBC: 4.79 10*6/uL (ref 3.80–5.10)
RDW: 11.7 % (ref 11.0–15.0)
WBC: 5.1 10*3/uL (ref 3.8–10.8)

## 2022-01-17 LAB — LIPID PANEL
Cholesterol: 225 mg/dL — ABNORMAL HIGH (ref <200)
HDL: 53 mg/dL (ref >=50)
LDL Cholesterol (Calc): 148 mg/dL (calc) — ABNORMAL HIGH
Non-HDL Cholesterol (Calc): 172 mg/dL (calc) — ABNORMAL HIGH (ref <130)
Total CHOL/HDL Ratio: 4.2 (calc) (ref <5.0)
Triglycerides: 118 mg/dL (ref <150)

## 2022-01-17 LAB — HEMOGLOBIN A1C
Hgb A1c MFr Bld: 5.4 % of total Hgb (ref <5.7)
Mean Plasma Glucose: 108 mg/dL
eAG (mmol/L): 6 mmol/L

## 2022-01-17 LAB — TSH: TSH: 2.1 mIU/L

## 2022-01-17 NOTE — Telephone Encounter (Signed)
Please call patient: Liver, kidney and thyroid functions are normal. Electrolytes and blood cell counts are normal. B12 is okay, at 466.  Is that the lower end of normal, she could benefit from adding additional B12 to her daily supplement.  I would recommend adding 1000 mcg of sublingual B12 solution daily. A1c/diabetes screening is normal at 5.4. Cholesterol is mildly above goal at 148 for her LDL.  Goal for her would be less than 135.  Her HDL/good cholesterol is good at 53.  The cholesterol ratio is still okay as well.  No medications needed at this time but would encourage routine exercise and a heart healthy diet high in fiber and low in saturated fats.

## 2022-01-17 NOTE — Telephone Encounter (Signed)
LM for pt to return call to discuss.  

## 2022-01-17 NOTE — Telephone Encounter (Signed)
Spoke with patient regarding results/recommendations.  

## 2022-04-03 ENCOUNTER — Other Ambulatory Visit: Payer: Self-pay | Admitting: Family Medicine

## 2022-04-03 DIAGNOSIS — Z1231 Encounter for screening mammogram for malignant neoplasm of breast: Secondary | ICD-10-CM

## 2022-07-12 ENCOUNTER — Other Ambulatory Visit: Payer: Self-pay

## 2022-07-12 ENCOUNTER — Other Ambulatory Visit (HOSPITAL_COMMUNITY): Payer: Self-pay

## 2022-07-12 NOTE — Telephone Encounter (Signed)
[facility] Day - Client Nonclinical Telephone Record  [facility] Client [name] Day - Client Client Site [facility] - Day Provider [place], [place] Contact Type Call Who Is Calling Patient / Member / Family / Caregiver Caller Name [name] Caller Phone Number [phone] Patient Name Mary [name] Patient DOB [birth year] Call Type Message Only Information Provided Reason for Call Medication Question / Request Initial Comment Caller needing medication refill for Linzeff . [facility] at [phone] at [address] Disp. Time Disposition Final User 07/12/2022 2:28:13 PM General Information Provided Yes Tiburcio Pea, Lanette Call Closed By: Evette Doffing Transaction Date/Time: 07/12/2022 2:24:39 PM (ET  RF request for Linzess LOV: 01/16/22 Next ov: N/A Last written: 10/08/18 historical provider  According to pt's chart, she has established care with Gillermina Phy PA as of yesterday. Pt advised she will need to request medication from her as we are unable to complete request.

## 2022-08-18 LAB — EXTERNAL GENERIC LAB PROCEDURE: COLOGUARD: NEGATIVE

## 2023-01-21 ENCOUNTER — Encounter: Payer: Medicaid Other | Admitting: Family Medicine
